# Patient Record
Sex: Female | Born: 1990 | Race: White | Hispanic: No | Marital: Single | Smoking: Former smoker
Health system: Southern US, Community
[De-identification: ages and names within clinical notes are randomized; demographics above are authoritative.]

## PROBLEM LIST (undated history)

## (undated) DIAGNOSIS — G43909 Migraine, unspecified, not intractable, without status migrainosus: Secondary | ICD-10-CM

## (undated) HISTORY — DX: Migraine, unspecified, not intractable, without status migrainosus: G43.909

## (undated) HISTORY — PX: NO PAST SURGERIES: SHX2092

---

## 2010-04-26 ENCOUNTER — Emergency Department: Payer: Self-pay | Admitting: Internal Medicine

## 2012-10-02 ENCOUNTER — Emergency Department: Payer: Self-pay | Admitting: Emergency Medicine

## 2012-10-02 LAB — CBC
HCT: 39.6 % (ref 35.0–47.0)
HGB: 13.8 g/dL (ref 12.0–16.0)
MCH: 30.3 pg (ref 26.0–34.0)
MCV: 87 fL (ref 80–100)
RBC: 4.55 10*6/uL (ref 3.80–5.20)
WBC: 6.3 10*3/uL (ref 3.6–11.0)

## 2013-12-16 ENCOUNTER — Emergency Department: Payer: Self-pay | Admitting: Emergency Medicine

## 2015-10-07 LAB — CBC AND DIFFERENTIAL
HCT: 39 % (ref 36–46)
Hemoglobin: 12.8 g/dL (ref 12.0–16.0)
Platelets: 210 10*3/uL (ref 150–399)
WBC: 5.3 10^3/mL

## 2015-10-08 LAB — BASIC METABOLIC PANEL
BUN: 14 mg/dL (ref 4–21)
CREATININE: 0.7 mg/dL (ref 0.5–1.1)
Glucose: 81 mg/dL
Potassium: 4.1 mmol/L (ref 3.4–5.3)
SODIUM: 138 mmol/L (ref 137–147)

## 2015-10-08 LAB — LIPID PANEL
Cholesterol: 146 mg/dL (ref 0–200)
HDL: 72 mg/dL — AB (ref 35–70)
LDL Cholesterol: 64 mg/dL
TRIGLYCERIDES: 52 mg/dL (ref 40–160)

## 2015-10-08 LAB — HEPATIC FUNCTION PANEL
ALT: 13 U/L (ref 7–35)
AST: 16 U/L (ref 13–35)
Alkaline Phosphatase: 52 U/L (ref 25–125)
BILIRUBIN, TOTAL: 0.7 mg/dL

## 2015-10-08 LAB — TSH: TSH: 1.66 u[IU]/mL (ref 0.41–5.90)

## 2016-10-09 ENCOUNTER — Emergency Department
Admission: EM | Admit: 2016-10-09 | Discharge: 2016-10-09 | Disposition: A | Discharge status: Home / Self Care | Payer: BLUE CROSS/BLUE SHIELD | Attending: Emergency Medicine | Admitting: Emergency Medicine

## 2016-10-09 ENCOUNTER — Encounter: Payer: Self-pay | Admitting: Emergency Medicine

## 2016-10-09 DIAGNOSIS — Z23 Encounter for immunization: Secondary | ICD-10-CM | POA: Insufficient documentation

## 2016-10-09 DIAGNOSIS — Y999 Unspecified external cause status: Secondary | ICD-10-CM | POA: Insufficient documentation

## 2016-10-09 DIAGNOSIS — S0121XA Laceration without foreign body of nose, initial encounter: Secondary | ICD-10-CM | POA: Diagnosis not present

## 2016-10-09 DIAGNOSIS — Y929 Unspecified place or not applicable: Secondary | ICD-10-CM | POA: Insufficient documentation

## 2016-10-09 DIAGNOSIS — W541XXA Struck by dog, initial encounter: Secondary | ICD-10-CM | POA: Insufficient documentation

## 2016-10-09 DIAGNOSIS — Y9389 Activity, other specified: Secondary | ICD-10-CM | POA: Diagnosis not present

## 2016-10-09 DIAGNOSIS — S0993XA Unspecified injury of face, initial encounter: Secondary | ICD-10-CM | POA: Diagnosis not present

## 2016-10-09 MED ORDER — TETANUS-DIPHTH-ACELL PERTUSSIS 5-2.5-18.5 LF-MCG/0.5 IM SUSP
0.5000 mL | Freq: Once | INTRAMUSCULAR | Status: AC
  Administered 2016-10-09: 0.5 mL via INTRAMUSCULAR
  Filled 2016-10-09: qty 0.5

## 2016-10-09 NOTE — ED Provider Notes (Signed)
[facility] Emergency Department Provider Note  ____________________________________________  Time seen: Approximately 7:25 PM  I have reviewed the triage vital signs and the nursing notes.   HISTORY  Chief Complaint Facial Injury    HPI Danielle Casey is a 25 y.o. female who presents to the emergency department for evaluation for a laceration to the bridge of her nose. Patient reports that earlier this evening her dog tried to escape and when she grabbed him he "headbutted" her. She denies any act of aggression or actual dog bite. She denies loss of consciousness, visual changes, epistaxis, nausea, or vomiting. She is unsure if she up to date on tetanus.   No past medical history on file.  There are no active problems to display for this patient.   No past surgical history on file.  Prior to Admission medications   Not on File    Allergies Patient has no known allergies.  No family history on file.  Social History Social History  Substance Use Topics  . Smoking status: Not on file  . Smokeless tobacco: Not on file  . Alcohol use Not on file     Review of Systems  Eyes: No visual changes.  ENT: No difficulty breathing through nares. Cardiovascular: no chest pain. Respiratory: no cough. No SOB. Gastrointestinal: No nausea, no vomiting. Musculoskeletal: Positive for nose pain. Skin: Positive for laceration to nose with mild swelling.  Neurological: Negative for headaches, focal weakness or numbness. No loss of consciousness  10-point ROS otherwise negative.  ____________________________________________   PHYSICAL EXAM:  VITAL SIGNS: ED Triage Vitals  Enc Vitals Group     BP 10/09/16 1848 108/77     Pulse Rate 10/09/16 1848 (!) 104     Resp 10/09/16 1848 16     Temp 10/09/16 1848 98.6 F (37 C)     Temp Source 10/09/16 1848 Oral     SpO2 10/09/16 1848 99 %     Weight 10/09/16 1850 108 lb (49 kg)     Height 10/09/16 1850 5'  (1.524 m)     Head Circumference --      Peak Flow --      Pain Score 10/09/16 1849 2     Pain Loc --      Pain Edu? --      Excl. in GC? --      Constitutional: Alert and oriented. Well appearing and in no acute distress. Eyes: Conjunctivae are normal. PERRLA. EOMI. Head: Laceration to bridge of nose. Non-tender to palpation along bridge of nose. No deviation or deformity noted.  ENT:      Nose: Nares patent bilaterally.  Cardiovascular: Normal rate, regular rhythm. Normal S1 and S2.  Good peripheral circulation. Respiratory: Normal respiratory effort without tachypnea or retractions. Lungs CTAB. Good air entry to the bases with no decreased or absent breath sounds. Musculoskeletal: Full range of motion to all extremities. No gross deformities appreciated. Neurologic:  Normal speech and language. No gross focal neurologic deficits are appreciated. CN II-XII grossly intact. Skin:  1 cm superficial laceration to the bridge of the nose. No disruption of cartilage. Mild swelling noted to bridge of nose. No ecchymosis or erythema. Mild bleeding at this time.  Psychiatric: Mood and affect are normal. Speech and behavior are normal. Patient exhibits appropriate insight and judgement.   ____________________________________________   LABS (all labs ordered are listed, but only abnormal results are displayed)  Labs Reviewed - No data to display ____________________________________________  EKG  None ____________________________________________  RADIOLOGY   No results found.  ____________________________________________    PROCEDURES  Procedure(s) performed:    Marland Kitchen.Marland Kitchen.Laceration Repair Date/Time: 10/09/2016 8:07 PM Performed by: Gala RomneyUTHRIELL, Hanan Moen D Authorized by: Gala RomneyUTHRIELL, Jewelia Bocchino D   Consent:    Consent obtained:  Verbal   Consent given by:  Patient   Risks discussed:  Poor cosmetic result, pain and infection   Alternatives discussed:  No treatment Anesthesia (see  MAR for exact dosages):    Anesthesia method:  None Laceration details:    Location:  Face   Face location:  Nose   Length (cm):  1 Repair type:    Repair type:  Simple Exploration:    Hemostasis achieved with:  Direct pressure   Wound extent: no foreign bodies/material noted and no underlying fracture noted   Treatment:    Area cleansed with:  Hibiclens   Amount of cleaning:  Standard Skin repair:    Repair method:  Steri-Strips Approximation:    Approximation:  Close   Vermilion border: well-aligned   Post-procedure details:    Dressing:  Open (no dressing)   Patient tolerance of procedure:  Tolerated well, no immediate complications      Medications  Tdap (BOOSTRIX) injection 0.5 mL (not administered)     ____________________________________________   INITIAL IMPRESSION / ASSESSMENT AND PLAN / ED COURSE  Pertinent labs & imaging results that were available during my care of the patient were reviewed by me and considered in my medical decision making (see chart for details).  Review of the [place] CSRS was performed in accordance of the [facility] prior to dispensing any controlled drugs.  Clinical Course     Patient's diagnosis is consistent with laceration to nose. No x-rays deemed necessary at this time. Patient is unsure of last tetanus shot and was updated today. Laceration is closed as described above. Patient to take Tylenol or Motrin as needed at home. She will follow up with primary care as needed. Patient is given ED precautions to return to the ED for any worsening or new symptoms.     ____________________________________________  FINAL CLINICAL IMPRESSION(S) / ED DIAGNOSES  Final diagnoses:  Laceration of nose, initial encounter      NEW MEDICATIONS STARTED DURING THIS VISIT:  New Prescriptions   No medications on file        This chart was dictated using voice recognition software/Dragon. Despite best efforts to proofread, errors can occur  which can change the meaning. Any change was purely unintentional.   Racheal PatchesJonathan D Gean Larose, PA-C 10/09/16 2047    Emily FilbertJonathan E Williams, MD 10/09/16 2140

## 2016-10-09 NOTE — ED Triage Notes (Signed)
Patient presents to the ED with a small laceration to the bridge of her nose.  Patient states, "my dog head butted me".  Patient is in no obvious distress at this time.  Patient denies loss of consciousness.

## 2017-01-03 ENCOUNTER — Encounter: Payer: Self-pay | Admitting: Family Medicine

## 2017-01-03 ENCOUNTER — Ambulatory Visit (INDEPENDENT_AMBULATORY_CARE_PROVIDER_SITE_OTHER): Payer: Self-pay | Admitting: Family Medicine

## 2017-01-03 VITALS — BP 116/79 | HR 112 | Temp 97.4°F | Wt 104.6 lb

## 2017-01-03 DIAGNOSIS — N6312 Unspecified lump in the right breast, upper inner quadrant: Secondary | ICD-10-CM | POA: Insufficient documentation

## 2017-01-03 DIAGNOSIS — Z309 Encounter for contraceptive management, unspecified: Secondary | ICD-10-CM | POA: Insufficient documentation

## 2017-01-03 DIAGNOSIS — N3001 Acute cystitis with hematuria: Secondary | ICD-10-CM

## 2017-01-03 DIAGNOSIS — R0789 Other chest pain: Secondary | ICD-10-CM | POA: Insufficient documentation

## 2017-01-03 DIAGNOSIS — Z30011 Encounter for initial prescription of contraceptive pills: Secondary | ICD-10-CM

## 2017-01-03 LAB — POCT URINALYSIS DIPSTICK
BILIRUBIN UA: NEGATIVE
GLUCOSE UA: NEGATIVE
KETONES UA: NEGATIVE
NITRITE UA: NEGATIVE
PH UA: 6.5
Protein, UA: NEGATIVE
Urobilinogen, UA: 0.2

## 2017-01-03 MED ORDER — NORGESTIMATE-ETH ESTRADIOL 0.25-35 MG-MCG PO TABS: 1.0000 | ORAL_TABLET | Freq: Every day | ORAL | 11 refills | Status: DC

## 2017-01-03 MED ORDER — NITROFURANTOIN MONOHYD MACRO 100 MG PO CAPS: 100.0000 mg | ORAL_CAPSULE | Freq: Two times a day (BID) | ORAL | 0 refills | Status: DC

## 2017-01-03 NOTE — Assessment & Plan Note (Signed)
New problem. Likely benign (possible fibroadenoma) given tenderness/pain. Patient does have a family hx of early breast cancer.  Discussed options with patient today.  Watchful waiting.  Follow up in 2 weeks. At that time will re-evaluate and discuss mammogram.

## 2017-01-03 NOTE — Assessment & Plan Note (Signed)
New acute problem. Treating with macrobid while awaiting culture.

## 2017-01-03 NOTE — Assessment & Plan Note (Signed)
New problem. No risk factors. Young, healthy female. Labs today. Doubt cardiac etiology. Likely stress, caffeine, possibly MSK. Reassurance provided today. F/U in 2 weeks.

## 2017-01-03 NOTE — Assessment & Plan Note (Signed)
Starting Sprintec today.

## 2017-01-03 NOTE — Progress Notes (Signed)
Pre visit review using our clinic review tool, if applicable. No additional management support is needed unless otherwise documented below in the visit note. 

## 2017-01-03 NOTE — Patient Instructions (Signed)
See you in 2 weeks.  Medication as prescribed.  Take care  Dr. Adriana Simasook

## 2017-01-03 NOTE — Progress Notes (Signed)
Subjective:  Patient ID: Danielle Casey, female    DOB: 20-Aug-1991  Age: 26 y.o. MRN: [id number]  CC: Breast lump, chest pain, UTI, Contraception  HPI Danielle Casey is a 26 y.o. female presents to the clinic today as a new patient with the above complaints.  Chest pain  X 1 year.  Intermittent.  Located sternally.  Occurs with activity and at rest.  Sharp and lasts for seconds and resolves spontaneously.  No SOB.  No diaphoresis.  No palpitations.  She reports significant stress,little sleep, and frequent use of "5 Hour"  She does have a family hx of CV disease but no personal risk factors.  Breast pain, lump  She noted some R breast pain 2 weeks ago.  She palpated her breast and felt a lump which was painful.  Located at the 2 o'clock position.  Tender to palpation.  Last menstrual cycle was 2 weeks ago.  Has a family hx of early breast cancer (Mother with breast cancer at 28-29).  No nipple discharge.  UTI  Reports 1 week history of urgency, dysuria.  Recent hematuria.  No known exacerbating or relieving factors.  No flank pain, abdominal pain, fever, chills.   Contraception  Currently sexually active and uses condoms.  Wants to start OCP's.  PMH, Surgical Hx, Family Hx, Social History reviewed and updated as below.  Past Medical History:  Diagnosis Date  . Migraines     Past Surgical History:  Procedure Laterality Date  . NO PAST SURGERIES     Family History  Problem Relation Age of Onset  . Breast cancer Mother   . Mental illness Mother   . Breast cancer Maternal Grandmother   . Arthritis Maternal Grandmother   . Hypertension Maternal Grandmother   . Diabetes Maternal Grandmother   . Mental illness Maternal Grandmother    Social History  Substance Use Topics  . Smoking status: Never Smoker  . Smokeless tobacco: Never Used  . Alcohol use 0.6 - 1.2 oz/week    1 - 2 Standard drinks or equivalent per week   Review of  Systems  Cardiovascular: Positive for chest pain.  Gastrointestinal: Positive for diarrhea.  Genitourinary: Positive for dysuria, hematuria and urgency.       Breast lump/pain.  Musculoskeletal:       Joint pain.  Neurological: Positive for headaches.  Psychiatric/Behavioral:       Stress.  All other systems reviewed and are negative.  Objective:   Today's Vitals: BP 116/79   Pulse (!) 112   Temp 97.4 F (36.3 C) (Oral)   Wt 104 lb 9.6 oz (47.4 kg)   SpO2 97%   BMI 20.43 kg/m   Physical Exam  Constitutional: She is oriented to person, place, and time. She appears well-developed. No distress.  HENT:  Head: Normocephalic and atraumatic.  Mouth/Throat: Oropharynx is clear and moist.  Eyes: Conjunctivae are normal.  Neck: Neck supple.  Cardiovascular: Normal rate and regular rhythm.   Pulmonary/Chest: Effort normal and breath sounds normal. She exhibits no tenderness.  Abdominal: Soft. She exhibits no distension. There is no tenderness.  Musculoskeletal: Normal range of motion.  Neurological: She is alert and oriented to person, place, and time.  Skin: Skin is warm. No rash noted.  Psychiatric: She has a normal mood and affect.  Breast - Right breast: small lump palpated at 2 o'clock position; tender to palpation. No axillary nodes. No nipple discharge.   Assessment & Plan:   Problem List Items  Addressed This Visit    Acute cystitis with hematuria    New acute problem. Treating with macrobid while awaiting culture.      Relevant Orders   POCT Urinalysis Dipstick (Completed)   Urine Culture   Breast lump on right side at 2 o'clock position    New problem. Likely benign (possible fibroadenoma) given tenderness/pain. Patient does have a family hx of early breast cancer.  Discussed options with patient today.  Watchful waiting.  Follow up in 2 weeks. At that time will re-evaluate and discuss mammogram.      Contraception management    Starting [place]el today.        Other chest pain - Primary    New problem. No risk factors. Young, healthy female. Labs today. Doubt cardiac etiology. Likely stress, caffeine, possibly MSK. Reassurance provided today. F/U in 2 weeks.      Relevant Orders   CBC   Comp Met (CMET)   Lipid panel   TSH      Outpatient Encounter Prescriptions as of 01/03/2017  Medication Sig  . nitrofurantoin, macrocrystal-monohydrate, (MACROBID) 100 MG capsule Take 1 capsule (100 mg total) by mouth 2 (two) times daily.  . norgestimate-ethinyl estradiol (SPRINTEC 28) 0.25-35 MG-MCG tablet Take 1 tablet by mouth daily.   No facility-administered encounter medications on file as of 01/03/2017.     Follow-up: Return in about 2 weeks (around 01/17/2017).  [place]

## 2017-01-04 LAB — LIPID PANEL
Chol/HDL Ratio: 1.7 ratio units (ref 0.0–4.4)
Cholesterol, Total: 146 mg/dL (ref 100–199)
HDL: 84 mg/dL (ref >39)
LDL Calculated: 54 mg/dL (ref 0–99)
TRIGLYCERIDES: 40 mg/dL (ref 0–149)
VLDL CHOLESTEROL CAL: 8 mg/dL (ref 5–40)

## 2017-01-04 LAB — COMPREHENSIVE METABOLIC PANEL
A/G RATIO: 1.6 (ref 1.2–2.2)
ALK PHOS: 82 IU/L (ref 39–117)
ALT: 30 IU/L (ref 0–32)
AST: 28 IU/L (ref 0–40)
Albumin: 4.6 g/dL (ref 3.5–5.5)
BILIRUBIN TOTAL: 0.6 mg/dL (ref 0.0–1.2)
BUN/Creatinine Ratio: 17 (ref 9–23)
BUN: 11 mg/dL (ref 6–20)
CO2: 23 mmol/L (ref 18–29)
Calcium: 9.3 mg/dL (ref 8.7–10.2)
Chloride: 100 mmol/L (ref 96–106)
Creatinine, Ser: 0.66 mg/dL (ref 0.57–1.00)
GFR calc non Af Amer: 121 mL/min/{1.73_m2} (ref >59)
GFR, EST AFRICAN AMERICAN: 140 mL/min/{1.73_m2} (ref >59)
GLUCOSE: 114 mg/dL — AB (ref 65–99)
Globulin, Total: 2.9 g/dL (ref 1.5–4.5)
POTASSIUM: 4.2 mmol/L (ref 3.5–5.2)
Sodium: 139 mmol/L (ref 134–144)
TOTAL PROTEIN: 7.5 g/dL (ref 6.0–8.5)

## 2017-01-04 LAB — TSH: TSH: 1.2 u[IU]/mL (ref 0.450–4.500)

## 2017-01-04 LAB — CBC
Hematocrit: 42.4 % (ref 34.0–46.6)
Hemoglobin: 13.2 g/dL (ref 11.1–15.9)
MCH: 28 pg (ref 26.6–33.0)
MCHC: 31.1 g/dL — ABNORMAL LOW (ref 31.5–35.7)
MCV: 90 fL (ref 79–97)
PLATELETS: 286 10*3/uL (ref 150–379)
RBC: 4.71 x10E6/uL (ref 3.77–5.28)
RDW: 13.8 % (ref 12.3–15.4)
WBC: 7.5 10*3/uL (ref 3.4–10.8)

## 2017-01-05 LAB — URINE CULTURE

## 2017-01-09 ENCOUNTER — Encounter: Payer: Self-pay | Admitting: Family Medicine

## 2017-01-24 ENCOUNTER — Ambulatory Visit: Payer: BLUE CROSS/BLUE SHIELD | Admitting: Family Medicine

## 2017-01-30 ENCOUNTER — Ambulatory Visit: Payer: BLUE CROSS/BLUE SHIELD | Admitting: Family Medicine

## 2017-02-06 ENCOUNTER — Ambulatory Visit: Payer: BLUE CROSS/BLUE SHIELD | Admitting: Family Medicine

## 2017-02-13 ENCOUNTER — Other Ambulatory Visit (HOSPITAL_COMMUNITY)
Admission: RE | Admit: 2017-02-13 | Discharge: 2017-02-13 | Disposition: A | Discharge status: Home / Self Care | Payer: Self-pay | Source: Ambulatory Visit | Attending: Family Medicine | Admitting: Family Medicine

## 2017-02-13 ENCOUNTER — Encounter: Payer: Self-pay | Admitting: Family Medicine

## 2017-02-13 ENCOUNTER — Ambulatory Visit (INDEPENDENT_AMBULATORY_CARE_PROVIDER_SITE_OTHER): Payer: Self-pay | Admitting: Family Medicine

## 2017-02-13 VITALS — BP 110/70 | HR 93 | Temp 98.3°F | Wt 109.2 lb

## 2017-02-13 DIAGNOSIS — R87810 Cervical high risk human papillomavirus (HPV) DNA test positive: Secondary | ICD-10-CM | POA: Insufficient documentation

## 2017-02-13 DIAGNOSIS — Z124 Encounter for screening for malignant neoplasm of cervix: Secondary | ICD-10-CM | POA: Insufficient documentation

## 2017-02-13 DIAGNOSIS — R0789 Other chest pain: Secondary | ICD-10-CM

## 2017-02-13 DIAGNOSIS — N6312 Unspecified lump in the right breast, upper inner quadrant: Secondary | ICD-10-CM

## 2017-02-13 NOTE — Assessment & Plan Note (Signed)
Resolved. No further chest pain.

## 2017-02-13 NOTE — Progress Notes (Signed)
Pre visit review using our clinic review tool, if applicable. No additional management support is needed unless otherwise documented below in the visit note. 

## 2017-02-13 NOTE — Assessment & Plan Note (Signed)
Persistent, uncertain prognosis at this time. Needs imaging. Arranging mammogram and US.

## 2017-02-13 NOTE — Progress Notes (Signed)
   Subjective:  Patient ID: Danielle Casey, female    DOB: August 23, 1991  Age: 26 y.o. MRN: [id number]  CC: Follow up  HPI:  26 year old female presents for follow up regarding the items below.  Breast lump  Continues to persist.  No longer tender be still present.  Family history of early breast cancer.  Chest pain  No further chest pain.  Resolved.  Feeling well.  Cervical cancer screening  In need of screening.  No reports of prior abnormal pap smears.  No reports of DUB, vaginal discharge.   Social Hx   Social History   Social History  . Marital status: Single    Spouse name: N/A  . Number of children: N/A  . Years of education: N/A   Social History Main Topics  . Smoking status: Never Smoker  . Smokeless tobacco: Never Used  . Alcohol use 0.6 - 1.2 oz/week    1 - 2 Standard drinks or equivalent per week  . Drug use: No  . Sexual activity: Yes    Partners: Male   Other Topics Concern  . None   Social History Narrative  . None    Review of Systems  Cardiovascular: Negative for chest pain.  Breast lump.    Objective:  BP 110/70   Pulse 93   Temp 98.3 F (36.8 C) (Oral)   Wt 109 lb 4 oz (49.6 kg)   SpO2 98%   BMI 21.34 kg/m   BP/Weight 02/13/2017 01/03/2017 10/09/2016  Systolic BP 110 116 108  Diastolic BP 70 79 77  Wt. (Lbs) 109.25 104.6 108  BMI 21.34 20.43 21.09    Physical Exam  Constitutional: She is oriented to person, place, and time. She appears well-developed. No distress.  Pulmonary/Chest: Effort normal.  Genitourinary:  Genitourinary Comments: Pelvic Exam: External: normal female genitalia without lesions or masses Vagina: normal without lesions or masses Cervix: normal without lesions or masses. Pap smear: performed   Neurological: She is alert and oriented to person, place, and time.  Psychiatric: She has a normal mood and affect.  Breast - Right breast with a small palpable mass at the 2 o'clock position.  Lab  Results  Component Value Date   WBC 7.5 01/03/2017   HGB 12.8 10/07/2015   HCT 42.4 01/03/2017   PLT 286 01/03/2017   GLUCOSE 114 (H) 01/03/2017   CHOL 146 01/03/2017   TRIG 40 01/03/2017   HDL 84 01/03/2017   LDLCALC 54 01/03/2017   ALT 30 01/03/2017   AST 28 01/03/2017   NA 139 01/03/2017   K 4.2 01/03/2017   CL 100 01/03/2017   CREATININE 0.66 01/03/2017   BUN 11 01/03/2017   CO2 23 01/03/2017   TSH 1.200 01/03/2017    Assessment & Plan:   Problem List Items Addressed This Visit    Breast lump on right side at 2 o'clock position - Primary    Persistent, uncertain prognosis at this time. Needs imaging. Arranging mammogram and US.      Other chest pain    Resolved. No further chest pain.      Screening for cervical cancer    Pap smear performed today.      Relevant Orders   Cytology - PAP   US BREAST LTD UNI RIGHT INC AXILLA      Follow-up: Pending mammogram  Everlene OtherJayce Carena Stream DO [facility] [place]

## 2017-02-13 NOTE — Assessment & Plan Note (Signed)
Pap smear performed today. 

## 2017-02-13 NOTE — Patient Instructions (Signed)
Be sure to get your mammogram.  We will call with results of your pap.  Take care  Dr. Adriana Simasook

## 2017-02-17 LAB — CYTOLOGY - PAP
Diagnosis: NEGATIVE
HPV (WINDOPATH): DETECTED — AB
HPV 16/18/45 genotyping: NEGATIVE

## 2017-02-20 ENCOUNTER — Telehealth: Payer: Self-pay

## 2017-02-20 NOTE — Telephone Encounter (Signed)
Referral is after next pap in 1 year (if positive). No need to rush.

## 2017-02-20 NOTE — Telephone Encounter (Signed)
A voicemail was left with results per DPR. Pt may callback please transfer call to me.

## 2017-02-20 NOTE — Telephone Encounter (Signed)
Pt okay with referral

## 2017-02-20 NOTE — Telephone Encounter (Signed)
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

## 2017-02-20 NOTE — Telephone Encounter (Signed)
Pt was advised of results. Will she need a repeat PAP. Pt questioned what happens if secondary PAP is positive.

## 2017-02-20 NOTE — Telephone Encounter (Signed)
Repeat pap in 1 year. If abnormal will send to GYN.

## 2017-02-20 NOTE — Telephone Encounter (Signed)
Pt called back returning your call. Thank you!  Call pt @ [phone]

## 2017-02-22 ENCOUNTER — Ambulatory Visit
Admission: RE | Admit: 2017-02-22 | Discharge: 2017-02-22 | Disposition: A | Discharge status: Home / Self Care | Payer: Self-pay | Source: Ambulatory Visit | Attending: Family Medicine | Admitting: Family Medicine

## 2017-02-22 DIAGNOSIS — Z124 Encounter for screening for malignant neoplasm of cervix: Secondary | ICD-10-CM

## 2017-03-08 ENCOUNTER — Telehealth: Payer: Self-pay | Admitting: Family Medicine

## 2017-03-08 NOTE — Telephone Encounter (Signed)
Pt called about the doctor where she got the mammo done is recommending her to get a MRI. Referral needed please and thank you! Pt wants to know if the doctor mentioned for pt to have a MRI done?  Call pt @ [phone]. Thank you!

## 2017-03-09 ENCOUNTER — Other Ambulatory Visit: Payer: Self-pay | Admitting: Family Medicine

## 2017-03-09 DIAGNOSIS — Z1239 Encounter for other screening for malignant neoplasm of breast: Secondary | ICD-10-CM

## 2017-03-09 NOTE — Telephone Encounter (Signed)
Order placed

## 2017-03-14 ENCOUNTER — Other Ambulatory Visit: Payer: Self-pay | Admitting: Family Medicine

## 2017-03-14 ENCOUNTER — Telehealth: Payer: Self-pay | Admitting: Family Medicine

## 2017-03-14 DIAGNOSIS — Z1239 Encounter for other screening for malignant neoplasm of breast: Secondary | ICD-10-CM

## 2017-03-14 NOTE — Telephone Encounter (Signed)
Gabby from [facility] called and stated that pt's order needs to be changed. It needs to be changed to MRI of b/l breast with and without contrast. Please advise, thank you!

## 2017-10-16 ENCOUNTER — Other Ambulatory Visit: Payer: Self-pay | Admitting: Family Medicine

## 2017-10-17 NOTE — Telephone Encounter (Signed)
Patient is switching care to Dr. Birdie SonsSonnenberg and her last PAP was 3/18 she is [facility] til appointment.

## 2018-01-02 ENCOUNTER — Ambulatory Visit: Payer: Self-pay | Admitting: Family Medicine

## 2018-01-17 ENCOUNTER — Other Ambulatory Visit: Payer: Self-pay | Admitting: Family Medicine

## 2018-01-31 ENCOUNTER — Ambulatory Visit: Payer: Self-pay | Admitting: Family Medicine

## 2018-01-31 DIAGNOSIS — Z0289 Encounter for other administrative examinations: Secondary | ICD-10-CM

## 2018-04-15 ENCOUNTER — Other Ambulatory Visit: Payer: Self-pay | Admitting: Family Medicine

## 2018-06-29 ENCOUNTER — Encounter: Payer: Self-pay | Admitting: Family

## 2018-07-11 ENCOUNTER — Encounter: Payer: Self-pay | Admitting: Family

## 2018-07-11 ENCOUNTER — Ambulatory Visit: Payer: BLUE CROSS/BLUE SHIELD | Admitting: Family

## 2018-07-11 ENCOUNTER — Telehealth: Payer: Self-pay | Admitting: *Deleted

## 2018-07-11 VITALS — BP 108/66 | HR 81 | Temp 98.7°F | Resp 16 | Ht 61.0 in | Wt 93.2 lb

## 2018-07-11 DIAGNOSIS — Z30011 Encounter for initial prescription of contraceptive pills: Secondary | ICD-10-CM

## 2018-07-11 DIAGNOSIS — Z124 Encounter for screening for malignant neoplasm of cervix: Secondary | ICD-10-CM

## 2018-07-11 DIAGNOSIS — E559 Vitamin D deficiency, unspecified: Secondary | ICD-10-CM | POA: Diagnosis not present

## 2018-07-11 DIAGNOSIS — F909 Attention-deficit hyperactivity disorder, unspecified type: Secondary | ICD-10-CM | POA: Insufficient documentation

## 2018-07-11 DIAGNOSIS — R634 Abnormal weight loss: Secondary | ICD-10-CM | POA: Insufficient documentation

## 2018-07-11 DIAGNOSIS — Z7721 Contact with and (suspected) exposure to potentially hazardous body fluids: Secondary | ICD-10-CM | POA: Insufficient documentation

## 2018-07-11 DIAGNOSIS — R41840 Attention and concentration deficit: Secondary | ICD-10-CM | POA: Diagnosis not present

## 2018-07-11 LAB — COMPREHENSIVE METABOLIC PANEL
ALT: 20 U/L (ref 0–35)
AST: 19 U/L (ref 0–37)
Albumin: 4.6 g/dL (ref 3.5–5.2)
Alkaline Phosphatase: 63 U/L (ref 39–117)
BUN: 15 mg/dL (ref 6–23)
CO2: 29 meq/L (ref 19–32)
Calcium: 9.9 mg/dL (ref 8.4–10.5)
Chloride: 105 mEq/L (ref 96–112)
Creatinine, Ser: 0.82 mg/dL (ref 0.40–1.20)
GFR: 87.62 mL/min (ref >60.00)
GLUCOSE: 87 mg/dL (ref 70–99)
POTASSIUM: 3.9 meq/L (ref 3.5–5.1)
SODIUM: 140 meq/L (ref 135–145)
Total Bilirubin: 0.5 mg/dL (ref 0.2–1.2)
Total Protein: 7.5 g/dL (ref 6.0–8.3)

## 2018-07-11 LAB — CBC WITH DIFFERENTIAL/PLATELET
BASOS ABS: 0.1 10*3/uL (ref 0.0–0.1)
Basophils Relative: 1.1 % (ref 0.0–3.0)
EOS PCT: 1.6 % (ref 0.0–5.0)
Eosinophils Absolute: 0.1 10*3/uL (ref 0.0–0.7)
HCT: 39 % (ref 36.0–46.0)
Hemoglobin: 13 g/dL (ref 12.0–15.0)
LYMPHS ABS: 2.5 10*3/uL (ref 0.7–4.0)
Lymphocytes Relative: 46.3 % — ABNORMAL HIGH (ref 12.0–46.0)
MCHC: 33.2 g/dL (ref 30.0–36.0)
MCV: 86 fl (ref 78.0–100.0)
MONO ABS: 0.4 10*3/uL (ref 0.1–1.0)
MONOS PCT: 6.8 % (ref 3.0–12.0)
NEUTROS ABS: 2.4 10*3/uL (ref 1.4–7.7)
Neutrophils Relative %: 44.2 % (ref 43.0–77.0)
PLATELETS: 204 10*3/uL (ref 150.0–400.0)
RBC: 4.54 Mil/uL (ref 3.87–5.11)
RDW: 13.2 % (ref 11.5–15.5)
WBC: 5.4 10*3/uL (ref 4.0–10.5)

## 2018-07-11 LAB — POCT URINE PREGNANCY: Preg Test, Ur: NEGATIVE

## 2018-07-11 LAB — VITAMIN D 25 HYDROXY (VIT D DEFICIENCY, FRACTURES): VITD: 22.15 ng/mL — ABNORMAL LOW (ref 30.00–100.00)

## 2018-07-11 LAB — TSH: TSH: 2.4 u[IU]/mL (ref 0.35–4.50)

## 2018-07-11 MED ORDER — NORGESTIMATE-ETH ESTRADIOL 0.25-35 MG-MCG PO TABS: 1.0000 | ORAL_TABLET | Freq: Every day | ORAL | 4 refills | Status: DC

## 2018-07-11 NOTE — Patient Instructions (Addendum)
Eye exam  Exercise for concentration  Please find out more information about your grandmother. Ensure you know all about family history of bleeding disorder , blood clots as we discussed.  Then call and let me know and we will go from there  Today we discussed referrals, orders. neuropsychiatry   I have placed these orders in the system for you.  Please be sure to give us a call if you have not heard from our office regarding scheduling a test or regarding referral in a timely manner.  It is very important that you let me know as soon as possible.

## 2018-07-11 NOTE — Assessment & Plan Note (Signed)
16 pound weight loss.  Suspect stress may be contributory however pending lab evaluation.  Will continue to follow closely.

## 2018-07-11 NOTE — Progress Notes (Signed)
Subjective:    Patient ID: Danielle Casey, female    DOB: 20-Jun-1991, 27 y.o.   MRN: [id number]  CC: Danielle Casey is a 27 y.o. female who presents today to establish care.    HPI: CC: problems focusing  Feels like more forgetful . H/o ADHD as child and never on medication at that time. Getting 7-8 hours of sleep per night. No excersising.  Drinks 5 hour energy shots as thinks mind is so 'cloudy' with some relief. Some stress with work as very serious. No depression.   No blurry vision or HA.   Ran out of sprintec and would like refill today   No personal history of DVT, migraine with aura. No history of cancer. Patient states she's not pregnant or breast-feeding.No concern for STDs.  Pap due; hpv positive. LMP: 2 weeks ago.   Non smoker.   No sure however thinks grandmother may have had a DVT. She will confirm this.   Weight loss has not been intentional. 'Eat what I want.'  Eating 3 meals per day. May be stress. No night sweats, bone pain.   Works in [employer]. Works from 2:30pm - 10:30pm.      HISTORY:  Past Medical History:  Diagnosis Date  . Migraines    Past Surgical History:  Procedure Laterality Date  . NO PAST SURGERIES     Family History  Problem Relation Age of Onset  . Breast cancer Mother 2920  . Mental illness Mother   . Breast cancer Maternal Grandmother   . Arthritis Maternal Grandmother   . Hypertension Maternal Grandmother   . Diabetes Maternal Grandmother   . Mental illness Maternal Grandmother     Allergies: Patient has no known allergies. No current outpatient medications on file prior to visit.   No current facility-administered medications on file prior to visit.     Social History   Tobacco Use  . Smoking status: Never Smoker  . Smokeless tobacco: Never Used  Substance Use Topics  . Alcohol use: Yes    Alcohol/week: 1.0 - 2.0 standard drinks    Types: 1 - 2 Standard drinks or equivalent per week  . Drug  use: No    Review of Systems  Constitutional: Positive for unexpected weight change. Negative for activity change, appetite change, chills and fever.  Respiratory: Negative for cough.   Cardiovascular: Negative for chest pain and palpitations.  Gastrointestinal: Negative for nausea and vomiting.  Genitourinary: Negative for pelvic pain and vaginal discharge.  Neurological: Negative for headaches.  Hematological: Negative for adenopathy.      Objective:    BP 108/66 (BP Location: Left Arm, Patient Position: Sitting, Cuff Size: Normal)   Pulse 81   Temp 98.7 F (37.1 C) (Oral)   Resp 16   Ht 5\' 1"  (1.549 m)   Wt 93 lb 4 oz (42.3 kg)   LMP 06/28/2018 (Approximate)   SpO2 97%   BMI 17.62 kg/m  BP Readings from Last 3 Encounters:  07/11/18 108/66  02/13/17 110/70  01/03/17 116/79   Wt Readings from Last 3 Encounters:  07/11/18 93 lb 4 oz (42.3 kg)  02/13/17 109 lb 4 oz (49.6 kg)  01/03/17 104 lb 9.6 oz (47.4 kg)    Physical Exam  Constitutional: She appears well-developed and well-nourished.  Eyes: Conjunctivae are normal.  Neck: No thyroid mass and no thyromegaly present.  Cardiovascular: Normal rate, regular rhythm, normal heart sounds and normal pulses.  Pulmonary/Chest: Effort normal  and breath sounds normal. She has no wheezes. She has no rhonchi. She has no rales.  Lymphadenopathy:       Head (right side): No submental, no submandibular, no tonsillar, no preauricular, no posterior auricular and no occipital adenopathy present.       Head (left side): No submental, no submandibular, no tonsillar, no preauricular, no posterior auricular and no occipital adenopathy present.  Neurological: She is alert.  Skin: Skin is warm and dry.  Psychiatric: She has a normal mood and affect. Her speech is normal and behavior is normal. Thought content normal.  Vitals reviewed.      Assessment & Plan:   Problem List Items Addressed This Visit      Other   Contraception  management    Patient is unsure as may have a remote history of DVT in her family.  Patient will confirm this and give me a call back.  If negative, would be comfortable starting oral contraceptive.  If positive, with either consult hematology or consult with OB/GYN for potential IUD.      Relevant Orders   POCT urine pregnancy (Completed)   Hepatitis C antibody   HIV antibody   Screening for cervical cancer    History of positive HPV.  Not a high-grade strain- negative 16, 18/45  Patient will return here for repeat Pap smear with co testing, she is overdue.      Weight loss - Primary    16 pound weight loss.  Suspect stress may be contributory however pending lab evaluation.  Will continue to follow closely.      Relevant Orders   CBC with Differential/Platelet   Comprehensive metabolic panel   VITAMIN D 25 Hydroxy (Vit-D Deficiency, Fractures)   TSH   Concentration deficit    Pending evaluation for ADHD.      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      Relevant Orders   Hepatitis C antibody   HIV antibody       I have discontinued Danielle Casey SPRINTEC 28.   No orders of the defined types were placed in this encounter.   Return precautions given.   Risks, benefits, and alternatives of the medications and treatment plan prescribed today were discussed, and patient expressed understanding.   Education regarding symptom management and diagnosis given to patient on AVS.  Continue to follow with Danielle Casey, Danielle Nygren Casey, Danielle Casey for routine health maintenance.   Danielle Casey and I agreed with plan.   Danielle PlowmanMargaret Anaissa Macfadden, Danielle Casey

## 2018-07-11 NOTE — Telephone Encounter (Signed)
Patient advised. She will call back to schedule appointment. 

## 2018-07-11 NOTE — Telephone Encounter (Signed)
Copied from CRM [phone]#[id number]. Topic: Inquiry >> Jul 11, 2018 12:59 PM Yvonna Alanisobinson, Andra M wrote: Reason for CRM: Patient called to let Rennie PlowmanMargaret Arnett know that there is no history of blood clots in her family. Patient saw Jason Cooprnett this morning, and was told to call bak with this information.       Thank You!!!

## 2018-07-11 NOTE — Assessment & Plan Note (Signed)
Patient is unsure as may have a remote history of DVT in her family.  Patient will confirm this and give me a call back.  If negative, would be comfortable starting oral contraceptive.  If positive, with either consult hematology or consult with OB/GYN for potential IUD.

## 2018-07-11 NOTE — Assessment & Plan Note (Signed)
Pending evaluation for ADHD.

## 2018-07-11 NOTE — Assessment & Plan Note (Signed)
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

## 2018-07-11 NOTE — Telephone Encounter (Signed)
Noted  I sent in refill of birth control for one year.  Please ensure she returns for pap  Her urine pregnancy test was also negative

## 2018-07-11 NOTE — Assessment & Plan Note (Addendum)
History of positive HPV.  Not a high-grade strain- negative 16, 18/45  Patient will return here for repeat Pap smear with co testing, she is overdue.

## 2018-07-12 LAB — HIV ANTIBODY (ROUTINE TESTING W REFLEX): HIV: NONREACTIVE

## 2018-07-12 LAB — HEPATITIS C ANTIBODY
Hepatitis C Ab: NONREACTIVE
SIGNAL TO CUT-OFF: 0.01 (ref <1.00)

## 2018-07-16 ENCOUNTER — Telehealth: Payer: Self-pay

## 2018-07-16 NOTE — Telephone Encounter (Signed)
Copied from CRM [phone]. Topic: General - Other >> Jul 16, 2018  3:04 PM Trula SladeWalter, Linda F wrote: Reason for CRM:   Patient was told to come back in one month and I tried to schedule her for 08/13/18 at 10:00 but it is not letting me put an appt in for that time.  Patient was told she would get a call back to schedule that appt.  Patient scheduled to see margarett on 08-10-18

## 2018-08-10 ENCOUNTER — Inpatient Hospital Stay: Payer: BLUE CROSS/BLUE SHIELD | Admitting: Family

## 2018-08-13 ENCOUNTER — Telehealth: Payer: Self-pay

## 2018-08-13 NOTE — Telephone Encounter (Signed)
  Copied from CRM [id number]. Topic: Appointment Scheduling - Prior Auth Required for Appointment >> Aug 13, 2018 12:01 PM Crist InfanteHarrald, Kathy J wrote: Pt would like to come in Friday at 10 am.  She forgot about this appt on 9/13. Can you open up the "same day" appt at 10:15am so I can get a 30 min in there? Thank you. Unable to contact office.  Advised pt soemone will call and confirm. Her previous appt on 9/13 was not a hosp fup!  Patient has been scheduled to see Margrett on 08-17-18 at 11:00

## 2018-08-13 NOTE — Telephone Encounter (Signed)
This was a 1 month follow up appointment

## 2018-08-13 NOTE — Telephone Encounter (Signed)
Copied from CRM [id number]. Topic: Appointment Scheduling - Prior Auth Required for Appointment >> Aug 13, 2018 12:01 PM Danielle InfanteHarrald, Danielle J wrote: Pt would like to come in Friday at 10 am.  She forgot about this appt on 9/13. Can you open up the "same day" appt at 10:15am so I can get a 30 min in there? Thank you. Unable to contact office.  Advised pt soemone will call and confirm. Her previous appt on 9/13 was not a hosp fup!

## 2018-08-14 ENCOUNTER — Other Ambulatory Visit: Payer: Self-pay

## 2018-08-14 ENCOUNTER — Encounter: Payer: Self-pay | Admitting: Family

## 2018-08-14 ENCOUNTER — Ambulatory Visit: Payer: BLUE CROSS/BLUE SHIELD | Admitting: Family

## 2018-08-14 VITALS — BP 110/64 | HR 68 | Resp 16 | Ht 60.75 in | Wt 92.0 lb

## 2018-08-14 DIAGNOSIS — Z7189 Other specified counseling: Secondary | ICD-10-CM

## 2018-08-14 DIAGNOSIS — F900 Attention-deficit hyperactivity disorder, predominantly inattentive type: Secondary | ICD-10-CM | POA: Diagnosis not present

## 2018-08-14 DIAGNOSIS — Z79899 Other long term (current) drug therapy: Secondary | ICD-10-CM

## 2018-08-14 DIAGNOSIS — Z8659 Personal history of other mental and behavioral disorders: Secondary | ICD-10-CM | POA: Diagnosis not present

## 2018-08-14 DIAGNOSIS — Z719 Counseling, unspecified: Secondary | ICD-10-CM

## 2018-08-14 MED ORDER — METHYLPHENIDATE HCL ER (OSM) 18 MG PO TBCR: 18.0000 mg | EXTENDED_RELEASE_TABLET | Freq: Every day | ORAL | 0 refills | Status: DC
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
## 2018-08-14 NOTE — Telephone Encounter (Signed)
Patient calle in stating that pharm is out of stock of Concerta and would like for us to send it to [facility] [place], [place]

## 2018-08-14 NOTE — Telephone Encounter (Signed)
Resent Rx to [facility] for lower cost. Concerta 18 mg daily, # 30 with no refills. RX for above e-scribed and sent to pharmacy on record  [facility] [place] - [address] [place] [id number] Phone: [phone] Fax: [phone]

## 2018-08-15 NOTE — Telephone Encounter (Signed)
That's fine Baxter HireKristen, you place her where you think. You have great judgement on these!

## 2018-08-16 ENCOUNTER — Encounter: Payer: Self-pay | Admitting: Family

## 2018-08-17 ENCOUNTER — Encounter: Payer: Self-pay | Admitting: Family

## 2018-08-17 ENCOUNTER — Ambulatory Visit: Payer: BLUE CROSS/BLUE SHIELD | Admitting: Family

## 2018-08-17 VITALS — BP 100/70 | HR 74 | Temp 98.0°F | Resp 15 | Ht 61.0 in | Wt 91.0 lb

## 2018-08-17 DIAGNOSIS — R634 Abnormal weight loss: Secondary | ICD-10-CM

## 2018-08-17 NOTE — Assessment & Plan Note (Signed)
Lost 2 pounds in one month. Chronic fatigue. Otherwise, asymptomatic. Pending endocrine consult for further advice, evaluation.

## 2018-08-17 NOTE — Patient Instructions (Signed)
Today we discussed referrals, orders. endocrine   I have placed these orders in the system for you.  Please be sure to give us a call if you have not heard from our office regarding scheduling a test or regarding referral in a timely manner.  It is very important that you let me know as soon as possible.    Pleasure seeing you!

## 2018-08-17 NOTE — Progress Notes (Signed)
Subjective:    Patient ID: Danielle Casey, female    DOB: [birth year], 27 y.o.   MRN: [id number]  CC: Danielle Casey is a 27 y.o. female who presents today for follow up.   HPI: ON OCP Lost 2 pounds since last visit. States mother and grandmother are her size as well, very petit. No night sweats, bone pain. Chronic fatigue for years, unchanged. Drinking 5 hour Energy drink- had been drinking 1-3 per day and now drinking 1 per day.   Started conserta 3 days ago with psychology. Consciously eating more.     HISTORY:  Past Medical History:  Diagnosis Date  . Migraines    Past Surgical History:  Procedure Laterality Date  . NO PAST SURGERIES     Family History  Problem Relation Age of Onset  . Breast cancer Mother 64  . Mental illness Mother   . Breast cancer Maternal Grandmother   . Arthritis Maternal Grandmother   . Hypertension Maternal Grandmother   . Diabetes Maternal Grandmother   . Mental illness Maternal Grandmother     Allergies: Patient has no known allergies. Current Outpatient Medications on File Prior to Visit  Medication Sig Dispense Refill  . methylphenidate (CONCERTA) 18 MG PO CR tablet Take 1 tablet (18 mg total) by mouth daily. 30 tablet 0  . norgestimate-ethinyl estradiol (SPRINTEC 28) 0.25-35 MG-MCG tablet Take 1 tablet by mouth daily. 3 Package 4   No current facility-administered medications on file prior to visit.     Social History   Tobacco Use  . Smoking status: Current Every Day Smoker    Types: E-cigarettes  . Smokeless tobacco: Current User  . Tobacco comment: Juul regularly  Substance Use Topics  . Alcohol use: Yes    Alcohol/week: 1.0 - 2.0 standard drinks    Types: 1 - 2 Standard drinks or equivalent per week  . Drug use: No    Review of Systems  Constitutional: Positive for fatigue and unexpected weight change. Negative for activity change, appetite change, chills and fever.  Respiratory: Negative for cough.     Cardiovascular: Negative for chest pain and palpitations.  Gastrointestinal: Negative for nausea and vomiting.      Objective:    BP 100/70 (BP Location: Left Arm, Patient Position: Sitting, Cuff Size: Normal)   Pulse 74   Temp 98 F (36.7 C) (Oral)   Resp 15   Ht 5\' 1"  (1.549 m)   Wt 91 lb (41.3 kg)   LMP 08/11/2018 (Approximate) Comment: OC's  SpO2 99%   BMI 17.19 kg/m  BP Readings from Last 3 Encounters:  08/17/18 100/70  07/11/18 108/66  02/13/17 110/70   Wt Readings from Last 3 Encounters:  08/17/18 91 lb (41.3 kg)  07/11/18 93 lb 4 oz (42.3 kg)  02/13/17 109 lb 4 oz (49.6 kg)    Physical Exam  Constitutional: She appears well-developed and well-nourished.  Eyes: Conjunctivae are normal.  Cardiovascular: Normal rate, regular rhythm, normal heart sounds and normal pulses.  Pulmonary/Chest: Effort normal and breath sounds normal. She has no wheezes. She has no rhonchi. She has no rales.  Neurological: She is alert.  Skin: Skin is warm and dry.  Psychiatric: She has a normal mood and affect. Her speech is normal and behavior is normal. Thought content normal.  Vitals reviewed.      Assessment & Plan:   Problem List Items Addressed This Visit      Other   Weight loss - Primary  Lost 2 pounds in one month. Chronic fatigue. Otherwise, asymptomatic. Pending endocrine consult for further advice, evaluation.      Relevant Orders   Ambulatory referral to Endocrinology       I am having Danielle Casey maintain her norgestimate-ethinyl estradiol and methylphenidate.   No orders of the defined types were placed in this encounter.   Return precautions given.   Risks, benefits, and alternatives of the medications and treatment plan prescribed today were discussed, and patient expressed understanding.   Education regarding symptom management and diagnosis given to patient on AVS.  Continue to follow with Danielle Casey, Danielle Gagen G, FNP for routine health  maintenance.   Danielle MargaritaIsabel Casey Walthour and I agreed with plan.   Danielle PlowmanMargaret Bryla Burek, FNP

## 2018-08-27 ENCOUNTER — Encounter: Payer: Self-pay | Admitting: Family

## 2018-08-27 ENCOUNTER — Ambulatory Visit: Payer: BLUE CROSS/BLUE SHIELD | Admitting: Family

## 2018-08-27 VITALS — BP 98/68 | HR 76 | Resp 16 | Ht 60.75 in | Wt 88.9 lb

## 2018-08-27 DIAGNOSIS — Z79899 Other long term (current) drug therapy: Secondary | ICD-10-CM

## 2018-08-27 DIAGNOSIS — R634 Abnormal weight loss: Secondary | ICD-10-CM

## 2018-08-27 DIAGNOSIS — R41840 Attention and concentration deficit: Secondary | ICD-10-CM

## 2018-08-27 DIAGNOSIS — Z719 Counseling, unspecified: Secondary | ICD-10-CM

## 2018-08-27 DIAGNOSIS — F900 Attention-deficit hyperactivity disorder, predominantly inattentive type: Secondary | ICD-10-CM | POA: Diagnosis not present

## 2018-08-27 DIAGNOSIS — Z7189 Other specified counseling: Secondary | ICD-10-CM

## 2018-08-27 MED ORDER — LISDEXAMFETAMINE DIMESYLATE 30 MG PO CAPS: 30.0000 mg | ORAL_CAPSULE | Freq: Every day | ORAL | 0 refills | Status: DC

## 2018-08-27 NOTE — Progress Notes (Signed)
Medication Check  Patient ID: Danielle Casey  DOB: [id number]  MRN: [id number]  DATE:08/27/18 Danielle Grana, FNP  Accompanied by: Self Patient Lives with: patient and mother  HISTORY/CURRENT STATUS: HPI  Patient here for routine follow up related to ADHD and medication management. Patient here by herself at today's visit. Patient seeing some positives, but not giving the focus ability of what patient is needing for work. Still working and not having any side effects. Patient eating regularly trying to maintain weight and following up today with PCP related to this. No changes with Juul more or less, but has continued. Patient aware that she needs to stop. Has continued with Concerta 18 mg 2 tablets daily, but wanting to discuss other treatment options.   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   MEDICAL HISTORY: Appetite: eating about 12-1:00 pm most days, before work and after work  Vitamin: D3 OTC Sleep: Bedtime: 12-2:00 am  Awakens: 10-12:00 pm  Concerns: Initiation/Maintenance/Other: No issues  Individual Medical History/ Review of Systems: Changes? :Yes, recently have lost weight and will see PCP today regarding weight loss. ?Endocrine or Oncology referrals.   Family Medical/ Social History: Changes? None reported  Current Medications:  Concerta 18 mg daily, 2 tablets  Medication Side Effects: None  MENTAL HEALTH: Mental Health Issues:  None Review of Systems  Constitutional: Positive for unexpected weight change.  All other systems reviewed and are negative.  PHYSICAL EXAM; Vitals:   08/27/18 1243  BP: 98/68  Pulse: 76  Resp: 16  Weight: 88 lb 14.4 oz (40.3 kg)  Height: 5' 0.75" (1.543 m)   Body mass index is 16.94 kg/m.  General Physical Exam: Cchanged from previous exam, date:Weight loss since visit on 08/14/18.  Testing/Developmental Screens: not completed today Reviewed with  patient at today's visit previous concerns and medications.   DIAGNOSES:    ICD-10-CM   1. ADHD (attention deficit hyperactivity disorder), inattentive type F90.0   2. Weight loss R63.4   3. Concentration deficit R41.840   4. Medication management Z79.899   5. Patient counseled Z71.9   6. Goals of care, counseling/discussion Z71.89     RECOMMENDATIONS:  Patient Instructions  Patient started on Concerta now 18 mg 2 daily to be discontinued related to minimal positive effects. Discussed options and will try Vyvanse 30 mg daily, # 30 with refills. Coupon provided for cost reduction to patient today. RX for above e-scribed and sent to pharmacy on record  [facility] - [place], [place] - [address] [place] [address] Phone: [phone] Fax: [phone]  Discussed change in medications along with use, dose, effects and side effects of medications at today's visit. Patient encouraged to call the office if experiencing any negative effects.  Information discussed with patient today regarding concerns for weight loss and will f/u at PCP today. Suggested referral to endocrinology along with oncology to rule out any abnormalities.   Encouraged eating regularly throughout the day with increased calories and protein with suggestion provided today. Also recommended exercising with weight training to build and tone muscle for weight management.   Patient verbalized understanding of all topics discussed at medication check appointment.    Patient verbalized understanding of all topics discussed.  NEXT APPOINTMENT:  Return in about 4 weeks (around 09/24/2018) for follow up visit.  Medical Decision-making: More than 50% of the appointment was spent counseling and discussing diagnosis and management of symptoms with the patient  and family.  Counseling Time: 25 minutes Total Contact Time: 30 minutes

## 2018-08-27 NOTE — Patient Instructions (Addendum)
Patient started on Concerta now 18 mg 2 daily to be discontinued related to minimal positive effects. Discussed options and will try Vyvanse 30 mg daily, # 30 with refills. Coupon provided for cost reduction to patient today. RX for above e-scribed and sent to pharmacy on record  [facility] - [place], [place] - [address] [place] [address] Phone: [phone] Fax: [phone]  Discussed change in medications along with use, dose, effects and side effects of medications at today's visit. Patient encouraged to call the office if experiencing any negative effects.  Information discussed with patient today regarding concerns for weight loss and will f/u at PCP today. Suggested referral to endocrinology along with oncology to rule out any abnormalities.   Encouraged eating regularly throughout the day with increased calories and protein with suggestion provided today. Also recommended exercising with weight training to build and tone muscle for weight management.   Patient verbalized understanding of all topics discussed at medication check appointment.

## 2018-08-29 ENCOUNTER — Encounter: Payer: Self-pay | Admitting: Family

## 2018-08-29 DIAGNOSIS — Z0289 Encounter for other administrative examinations: Secondary | ICD-10-CM

## 2018-09-25 ENCOUNTER — Encounter: Payer: Self-pay | Admitting: Family

## 2018-09-25 ENCOUNTER — Ambulatory Visit: Payer: BLUE CROSS/BLUE SHIELD | Admitting: Family

## 2018-09-25 ENCOUNTER — Telehealth: Payer: Self-pay | Admitting: Family

## 2018-09-25 VITALS — BP 102/72 | HR 76 | Resp 16 | Ht 60.75 in | Wt 85.6 lb

## 2018-09-25 DIAGNOSIS — Z79899 Other long term (current) drug therapy: Secondary | ICD-10-CM

## 2018-09-25 DIAGNOSIS — Z719 Counseling, unspecified: Secondary | ICD-10-CM | POA: Diagnosis not present

## 2018-09-25 DIAGNOSIS — Z713 Dietary counseling and surveillance: Secondary | ICD-10-CM

## 2018-09-25 DIAGNOSIS — R634 Abnormal weight loss: Secondary | ICD-10-CM | POA: Diagnosis not present

## 2018-09-25 DIAGNOSIS — F900 Attention-deficit hyperactivity disorder, predominantly inattentive type: Secondary | ICD-10-CM

## 2018-09-25 DIAGNOSIS — Z7189 Other specified counseling: Secondary | ICD-10-CM

## 2018-09-25 MED ORDER — LISDEXAMFETAMINE DIMESYLATE 30 MG PO CAPS: 30.0000 mg | ORAL_CAPSULE | Freq: Every day | ORAL | 0 refills | Status: DC

## 2018-09-25 NOTE — Progress Notes (Signed)
Patient ID: Danielle Casey, female   DOB: [birth year], 27 y.o.   MRN: [id number] Medication Check/Follow up Appointment  Patient ID: Danielle Casey  DOB: [id number]  MRN: [id number]  DATE:09/25/18 [name], FNP  Accompanied by: Self Patient Lives with: mother  HISTORY/CURRENT STATUS: HPI  Patient here for routine follow up related to ADHD and medication management. Recently changes patient from Concerta to Vyvanse 30 mg with no side effects. Patient here by herself and doing well at work. Continued weight loss with no changes in appetite, eating habits, exercise or work pattern. Has contacted primary care to have referral to Endocrine or Oncology, but no returned call or appointment. Patient doing well on the change of medication with positive results, but increased cost incurred with recent Rx. Not using any energy drinks and Juul has remained the same.   EDUCATION/WORK: Employer: [employer] Hours: 2:30-10:30 pm Days: Monday-Friday.   MEDICAL HISTORY: Appetite: Good, more now and consciously eating.  Recent tooth extraction with minimal eating for 2 weeks, but had been eating good amount of foods.  Vitamin:D3 Sleep: Bedtime: 12-2:00 am  Awakens: 10-12:00 pm   Concerns: Initiation/Maintenance/Other: None recently with medication change.   Individual Medical History/ Review of Systems: Changes? :Yes, had tooth extraction and for 2 weeks has been eating only soft foods with not as much calories. No f/u with PCP recently and needs referral to Endocrine or Oncology.   Family Medical/ Social History: Changes? None  Current Medications:  Vyvanse 30 mg daily Medication Side Effects: None  MENTAL HEALTH: Mental Health Issues:  Anxiety-more recently related to weight loss Review of Systems  Constitutional: Positive for unexpected weight change.       More weight loss since last visit  Eyes:       Vision change  Psychiatric/Behavioral: Positive  for decreased concentration.  All other systems reviewed and are negative.  Patient with no concerns for toileting. Daily stool, no constipation or diarrhea. Void urine no difficulty. No enuresis.   Participate in daily oral hygiene to include brushing and flossing.  PHYSICAL EXAM; **Loss 2.8 lbs** Vitals:   09/25/18 1010  BP: 102/72  Pulse: 76  Resp: 16  Weight: 85 lb 9.6 oz (38.8 kg)  Height: 5' 0.75" (1.543 m)   Body mass index is 16.31 kg/m.  General Physical Exam: Changed from previous exam, date:Weight loss   Physical Exam  Constitutional: She is oriented to person, place, and time. She appears well-developed.  HENT:  Head: Normocephalic and atraumatic.  Right Ear: External ear normal.  Left Ear: External ear normal.  Nose: Nose normal.  Mouth/Throat: Oropharynx is clear and moist.  Eyes: Pupils are equal, round, and reactive to light. Conjunctivae and EOM are normal.  Neck: Normal range of motion. Neck supple.  Cardiovascular: Normal rate, regular rhythm, normal heart sounds and intact distal pulses.  Abdominal: Soft. Bowel sounds are normal.  Genitourinary:  Genitourinary Comments: Deferred  Musculoskeletal: Normal range of motion.  Neurological: She is alert and oriented to person, place, and time. She has normal reflexes.  Skin: Skin is warm and dry. Capillary refill takes less than 2 seconds.  Psychiatric: She has a normal mood and affect. Her behavior is normal. Judgment and thought content normal.  Vitals reviewed.  No concerns for toileting. Daily stool, no constipation or diarrhea. Void urine no difficulty. No enuresis.   Participate in daily oral hygiene to include brushing and flossing.  Testing/Developmental Screens: CGI/ASRS = 16/17  Reviewed  with patient and counseled at the visit.   DIAGNOSES:    ICD-10-CM   1. ADHD (attention deficit hyperactivity disorder), inattentive type F90.0 lisdexamfetamine (VYVANSE) 30 MG capsule  2. Weight loss,  non-intentional R63.4   3. Nutritional counseling Z71.3   4. Patient counseled Z71.9   5. Medication management Z79.899   6. Goals of care, counseling/discussion Z71.89     RECOMMENDATIONS:  Patient Instructions  Patient to continue with Vyvanse 30 mg daily, # 30 with no refills. RX for above e-scribed and sent to pharmacy on record  [facility] - [place], [place] - [address] [place] [address] Phone: [phone] Fax: [phone]  Patient also provided with information and form for [facility] related to medication cost assistance for her month Vyvanse out of pocket expense. Physician portion completed at today's visit.   Recommended patient f/u with PCP today related to ongoing issues with weight. To send note to PCP today for referral for Endocrinology or Oncology.   Counseling at this visit included the review of old records and/or current chart with the patient since last office visit with patient.  Discussed recent history and today's examination with patient with no physical changes, but 2.8 lb weight loss.   Counseled regarding recent weight loss since last visit. Patient with no reported changes other than tooth extraction with appetite change for about 2 weeks.  Encourage calorie dense foods when hungry. Encourage snacks in the afternoon/evening. Add calories to food being consumed like switching to whole milk products, using instant breakfast type powders, increasing calories of foods with butter, sour cream, mayonnaise, cheese or ranch dressing. Can add potato flakes or powdered milk.   Counseled medication pharmacokinetics, options, dosage, administration, desired effects, and possible side effects.    Advised importance of:  Good sleep hygiene (8- 10 hours per night, no TV or video games for 1 hour before bedtime) Limited screen time (none on school nights, no more than 2 hours/day on weekends, use of screen time for  motivation) Regular exercise(outside and active play) Healthy eating (drink water or milk, no sodas/sweet tea, limit portions and no seconds).   Patient verbalized understanding of all topics discussed at today's visit.   NEXT APPOINTMENT:  Return in about 3 months (around 12/26/2018) for follow up .  Medical Decision-making: More than 50% of the appointment was spent counseling and discussing diagnosis and management of symptoms with the patient and family.  Counseling Time: 30 minutes Total Contact Time: 45 minutes

## 2018-09-25 NOTE — Patient Instructions (Addendum)
Patient to continue with Vyvanse 30 mg daily, # 30 with no refills. RX for above e-scribed and sent to pharmacy on record  [facility] - [place], [place] - [address] [place] [address] Phone: [phone] Fax: [phone]  Patient also provided with information and form for [facility] related to medication cost assistance for her month Vyvanse out of pocket expense. Physician portion completed at today's visit.

## 2018-09-25 NOTE — Telephone Encounter (Signed)
Follow up note sent to PCP related to continued weight loss.

## 2018-09-26 ENCOUNTER — Telehealth: Payer: Self-pay | Admitting: Family

## 2018-09-26 ENCOUNTER — Ambulatory Visit: Payer: Self-pay | Admitting: Family

## 2018-09-26 DIAGNOSIS — R634 Abnormal weight loss: Secondary | ICD-10-CM

## 2018-09-26 NOTE — Telephone Encounter (Signed)
Left voice mail for patient to call back ok for PEC to speak to patient referral placed to endocrine

## 2018-09-26 NOTE — Telephone Encounter (Signed)
Melissa,  How soon can she see endocrine?  Kristen, Call pt. We are working on referral for weight loss to endocrine. Please have her call us if she doesn't hear from [place] in a week

## 2018-09-27 ENCOUNTER — Ambulatory Visit: Payer: BLUE CROSS/BLUE SHIELD | Admitting: Family Medicine

## 2018-09-27 ENCOUNTER — Encounter: Payer: Self-pay | Admitting: Family Medicine

## 2018-09-27 VITALS — BP 100/62 | HR 77 | Temp 98.4°F | Ht 60.0 in | Wt 87.8 lb

## 2018-09-27 DIAGNOSIS — R634 Abnormal weight loss: Secondary | ICD-10-CM

## 2018-09-27 DIAGNOSIS — R5383 Other fatigue: Secondary | ICD-10-CM

## 2018-09-27 LAB — COMPREHENSIVE METABOLIC PANEL
ALK PHOS: 58 U/L (ref 39–117)
ALT: 14 U/L (ref 0–35)
AST: 17 U/L (ref 0–37)
Albumin: 4.4 g/dL (ref 3.5–5.2)
BILIRUBIN TOTAL: 0.9 mg/dL (ref 0.2–1.2)
BUN: 14 mg/dL (ref 6–23)
CO2: 30 meq/L (ref 19–32)
Calcium: 9.2 mg/dL (ref 8.4–10.5)
Chloride: 104 mEq/L (ref 96–112)
Creatinine, Ser: 0.8 mg/dL (ref 0.40–1.20)
GFR: 90.02 mL/min (ref >60.00)
GLUCOSE: 81 mg/dL (ref 70–99)
Potassium: 3.9 mEq/L (ref 3.5–5.1)
Sodium: 139 mEq/L (ref 135–145)
TOTAL PROTEIN: 7.2 g/dL (ref 6.0–8.3)

## 2018-09-27 LAB — CBC WITH DIFFERENTIAL/PLATELET
BASOS ABS: 0.1 10*3/uL (ref 0.0–0.1)
Basophils Relative: 1 % (ref 0.0–3.0)
EOS ABS: 0.1 10*3/uL (ref 0.0–0.7)
Eosinophils Relative: 1.1 % (ref 0.0–5.0)
HEMATOCRIT: 40.8 % (ref 36.0–46.0)
Hemoglobin: 13.4 g/dL (ref 12.0–15.0)
LYMPHS ABS: 3.2 10*3/uL (ref 0.7–4.0)
LYMPHS PCT: 48.5 % — AB (ref 12.0–46.0)
MCHC: 32.9 g/dL (ref 30.0–36.0)
MCV: 87.3 fl (ref 78.0–100.0)
MONOS PCT: 5.3 % (ref 3.0–12.0)
Monocytes Absolute: 0.3 10*3/uL (ref 0.1–1.0)
NEUTROS ABS: 2.9 10*3/uL (ref 1.4–7.7)
NEUTROS PCT: 44.1 % (ref 43.0–77.0)
PLATELETS: 206 10*3/uL (ref 150.0–400.0)
RBC: 4.68 Mil/uL (ref 3.87–5.11)
RDW: 13.1 % (ref 11.5–15.5)
WBC: 6.5 10*3/uL (ref 4.0–10.5)

## 2018-09-27 LAB — C-REACTIVE PROTEIN: CRP: <0.1 mg/dL — ABNORMAL LOW (ref 0.5–20.0)

## 2018-09-27 LAB — SEDIMENTATION RATE: Sed Rate: 3 mm/hr (ref 0–20)

## 2018-09-27 NOTE — Patient Instructions (Signed)
 High-Protein and High-Calorie Diet Eating high-protein and high-calorie foods can help you to gain weight, heal after an injury, and recover after an illness or surgery. What is my plan? The specific amount of daily protein and calories you need depends on:  Your body weight.  The reason this diet is recommended for you.  Generally, a high-protein, high-calorie diet involves:  Eating 250-500 extra calories each day.  Making sure that 10-35% of your daily calories come from protein.  Talk to your health care provider about how much protein and how many calories you need each day. Follow the diet as directed by your health care provider. What do I need to know about this diet?  Ask your health care provider if you should take a nutritional supplement.  Try to eat six small meals each day instead of three large meals.  Eat a balanced diet, including one food that is high in protein at each meal.  Keep nutritious snacks handy, such as nuts, trail mixes, dried fruit, and yogurt.  If you have kidney disease or diabetes, eating too much protein may put extra stress on your kidneys. Talk to your health care provider if you have either of those conditions. What are some high-protein foods? Grains Quinoa. Bulgur wheat. Vegetables Soybeans. Peas. Meats and Other Protein Sources Beef, pork, and poultry. Fish and seafood. Eggs. Tofu. Textured vegetable protein (TVP). Peanut butter. Nuts and seeds. Dried beans. Protein powders. Dairy Whole milk. Whole-milk yogurt. Powdered milk. Cheese. Cottage Cheese. Eggnog. Beverages High-protein supplement drinks. Soy milk. Other Protein bars. The items listed above may not be a complete list of recommended foods or beverages. Contact your dietitian for more options. What are some high-calorie foods? Grains Pasta. Quick breads. Muffins. Pancakes. Ready-to-eat cereal. Vegetables Vegetables cooked in oil or butter. Fried potatoes. Fruits Dried  fruit. Fruit leather. Canned fruit in syrup. Fruit juice. Avocados. Meats and Other Protein Sources Peanut butter. Nuts and seeds. Dairy Heavy cream. Whipped cream. Cream cheese. Sour cream. Ice cream. Custard. Pudding. Beverages Meal-replacement beverages. Nutrition shakes. Fruit juice. Sugar-sweetened soft drinks. Condiments Salad dressing. Mayonnaise. Alfredo sauce. Fruit preserves or jelly. Honey. Syrup. Sweets/Desserts Cake. Cookies. Pie. Pastries. Candy bars. Chocolate. Fats and Oils Butter or margarine. Oil. Gravy. Other Meal-replacement bars. The items listed above may not be a complete list of recommended foods or beverages. Contact your dietitian for more options. What are some tips for including high-protein and high-calorie foods in my diet?  Add whole milk, half-and-half, or heavy cream to cereal, pudding, soup, or hot cocoa.  Add whole milk to instant breakfast drinks.  Add peanut butter to oatmeal or smoothies.  Add powdered milk to baked goods, smoothies, or milkshakes.  Add powdered milk, cream, or butter to mashed potatoes.  Add cheese to cooked vegetables.  Make whole-milk yogurt parfaits. Top them with granola, fruit, or nuts.  Add cottage cheese to your fruit.  Add avocados, cheese, or both to sandwiches or salads.  Add meat, poultry, or seafood to rice, pasta, casseroles, salads, and soups.  Use mayonnaise when making egg salad, chicken salad, or tuna salad.  Use peanut butter as a topping for pretzels, celery, or crackers.  Add beans to casseroles, dips, and spreads.  Add pureed beans to sauces and soups.  Replace calorie-free drinks with calorie-containing drinks, such as milk and fruit juice. This information is not intended to replace advice given to you by your health care provider. Make sure you discuss any questions you have with your   health care provider. Document Released: 11/14/2005 Document Revised: 04/21/2016 Document Reviewed:  04/29/2014 Elsevier Interactive Patient Education  2018 Elsevier Inc.  

## 2018-09-27 NOTE — Progress Notes (Signed)
Subjective:    Patient ID: Danielle Casey, female    DOB: [birth year], 27 y.o.   MRN: [id number]  HPI   Patient presents to clinic to follow up on her unexplained weight loss. Patient states the weight loss has been present since before August and also before she started Vyvanse. Patient states she consciously makes sure she eats and does not restrict herself from eating. States she eats foods high in protein and calories and is unsure why she is losing weight. States that she knows the Vyvanse is an appetite suppressant as well as used for ADHD, but does not believe she is eating less because of the medication.   Lab work was done in August 2019 and was unremarkable except for Vit D deficiency - patient has started supplement. TSH, CBC, CMP were normal. Hep C and HIV were negative.   Wt Readings from Last 3 Encounters:  09/27/18 87 lb 12.8 oz (39.8 kg)  08/17/18 91 lb (41.3 kg)  07/11/18 93 lb 4 oz (42.3 kg)   Patient Active Problem List   Diagnosis Date Noted  . Weight loss 07/11/2018  . Concentration deficit 07/11/2018  . Exposure to blood-borne pathogen 07/11/2018  . Screening for cervical cancer 02/13/2017  . Breast lump on right side at 2 o'clock position 01/03/2017  . Other chest pain 01/03/2017  . Contraception management 01/03/2017   Social History   Tobacco Use  . Smoking status: Current Every Day Smoker    Types: E-cigarettes  . Smokeless tobacco: Current User  . Tobacco comment: Juul regularly  Substance Use Topics  . Alcohol use: Yes    Alcohol/week: 1.0 - 2.0 standard drinks    Types: 1 - 2 Standard drinks or equivalent per week   Review of Systems  Constitutional: +fatigue. Negative for chills, and fever.  HENT: Negative for congestion, ear pain, sinus pain and sore throat.   Eyes: Negative.   Respiratory: Negative for cough, shortness of breath and wheezing.   Cardiovascular: Negative for chest pain, palpitations and leg swelling.  Gastrointestinal:  Negative for abdominal pain, diarrhea, nausea and vomiting.  Genitourinary: Negative for dysuria, frequency and urgency.  Musculoskeletal: Negative for arthralgias and myalgias.  Skin: Negative for color change, pallor and rash.  Neurological: Negative for syncope, light-headedness and headaches.  Psychiatric/Behavioral: The patient is not nervous/anxious.       Objective:   Physical Exam  Constitutional: She is oriented to person, place, and time. No distress.  Very thin female  HENT:  Head: Normocephalic and atraumatic.  Eyes: Conjunctivae and EOM are normal. No scleral icterus.  Neck: Neck supple. No tracheal deviation present.  Cardiovascular: Normal rate, regular rhythm and normal heart sounds.  Pulmonary/Chest: Effort normal and breath sounds normal. No respiratory distress.  Abdominal: Soft. Bowel sounds are normal.  Musculoskeletal: She exhibits no edema.  Gait normal  Neurological: She is alert and oriented to person, place, and time.  Skin: Skin is warm and dry. She is not diaphoretic. No pallor.  Psychiatric: She has a normal mood and affect. Her behavior is normal.  Nursing note and vitals reviewed.     Vitals:   09/27/18 1046 09/27/18 1100  BP: (!) 90/58 100/62  Pulse: 77   Temp: 98.4 F (36.9 C)   SpO2: 98%     Assessment & Plan:   Unexplained weight loss -- Endocrine referral placed by M.Arnett FNP and appt is pending at this time. We will get more lab work today including repeat CBC,  CMP, thyroid and also ANA and sed rate. Patient was unable to urinate, so we will place future order for for urinalysis.  Discussed that while Vyvanse may not be cause of weight loss but it could be making it worse and encouraged her to consider lessening the dose or stopping medication to see if any difference is made. Discussed eating a high calorie and high protein diet to promote weight gain: What are some tips for including high-protein and high-calorie foods in my  diet?  Add whole milk, half-and-half, or heavy cream to cereal, pudding, soup, or hot cocoa.  Add whole milk to instant breakfast drinks.  Add peanut butter to oatmeal or smoothies.  Add powdered milk to baked goods, smoothies, or milkshakes.  Add powdered milk, cream, or butter to mashed potatoes.  Add cheese to cooked vegetables.  Make whole-milk yogurt parfaits. Top them with granola, fruit, or nuts.  Add cottage cheese to your fruit.  Add avocados, cheese, or both to sandwiches or salads.  Add meat, poultry, or seafood to rice, pasta, casseroles, salads, and soups.  Use mayonnaise when making egg salad, chicken salad, or tuna salad.  Use peanut butter as a topping for pretzels, celery, or crackers.  Add beans to casseroles, dips, and spreads.  Add pureed beans to sauces and soups.  Replace calorie-free drinks with calorie-containing drinks, such as milk and fruit juice.    Patient aware that she will be contacted with information regarding endocrine referral.

## 2018-09-27 NOTE — Telephone Encounter (Signed)
Pt returned call. Pt says that she is waiting for Endocrine to call her to schedule.

## 2018-09-27 NOTE — Telephone Encounter (Signed)
Left voice mail for patient to call back ok for PEC to speak to patient , regarding below message    

## 2018-09-27 NOTE — Telephone Encounter (Signed)
Referral was sent to [facility] on 10/31. Referral was placed on 10/30. They will review her notes and give her a call to schedule.

## 2018-09-28 ENCOUNTER — Telehealth: Payer: Self-pay | Admitting: Family

## 2018-09-28 NOTE — Telephone Encounter (Signed)
Left message for pt to return call to the office. See result note. 

## 2018-09-28 NOTE — Telephone Encounter (Signed)
Patient returning a call to obtain lab results. Nurse triage currently unavailable. Please advise.    Copied from CRM [id number]. Topic: Quick Communication - Lab Results (Clinic Use ONLY) >> Sep 28, 2018  2:22 PM Clearnce Sorrel, [place] wrote: Called patient to inform them of 09/27/2018 lab results. When patient returns call, triage nurse may disclose results.

## 2018-09-29 LAB — THYROID PANEL WITH TSH
FREE THYROXINE INDEX: 2.9 (ref 1.4–3.8)
T3 Uptake: 26 % (ref 22–35)
T4 TOTAL: 11.1 ug/dL (ref 5.1–11.9)
TSH: 2.57 mIU/L

## 2018-09-29 LAB — ANA: ANA: NEGATIVE

## 2018-10-02 NOTE — Telephone Encounter (Signed)
Left voicemail for patient to call . Please advise

## 2018-10-04 NOTE — Telephone Encounter (Signed)
Nate from [facility] calling.  States that Dr. Rosaland Lao reviewed notes and based off of lab results - normal TSH - they would like additional testing to determine if this is truly an endocrine issue before accepting referral. If labs show endocrine issue please re-refer.

## 2018-10-04 NOTE — Telephone Encounter (Signed)
Please advise 

## 2018-10-10 NOTE — Telephone Encounter (Signed)
Please call pt Advise her of what endocrine called and stated  How is her weight ? Has she lost any more weight?

## 2018-10-12 NOTE — Telephone Encounter (Signed)
Left voicemail for patient to call, ok for PEC to speak with patient

## 2018-10-16 NOTE — Telephone Encounter (Signed)
Pt has not been contacted by Endo. I have provided her the ph # for [facility]. Pt will call.   Pt states no additional weight loss since visit with Claris CheMargaret.   Call back # if needed [phone]

## 2018-10-16 NOTE — Telephone Encounter (Signed)
FYI

## 2018-10-17 ENCOUNTER — Telehealth: Payer: Self-pay

## 2018-10-17 NOTE — Telephone Encounter (Signed)
Copied from CRM #[id number]. Topic: Referral - Status >> Oct 17, 2018  2:10 PM Alexander, Amber L wrote: Reason for CRM:   Pt calling in and states that she got a call from endocrinology who denied her referral.  Pt wants to know what she should do at this point. Pt can be reached at [phone] 

## 2018-10-17 NOTE — Telephone Encounter (Signed)
Left voicemail to call , why did endocrinology deny referral

## 2018-10-17 NOTE — Telephone Encounter (Signed)
Spoke with patient she states [facility] denied referral after reviewing labs . They stated there was no reason to to see patient

## 2018-10-17 NOTE — Telephone Encounter (Signed)
Copied from CRM [id number]. Topic: Referral - Status >> Oct 17, 2018  2:10 PM Baldo DaubAlexander, Amber L wrote: Reason for CRM:   Pt calling in and states that she got a call from endocrinology who denied her referral.  Pt wants to know what she should do at this point. Pt can be reached at [phone]

## 2018-10-17 NOTE — Telephone Encounter (Signed)
Call pt  Endocrine has denied referral for weight loss as her TSH is normal and doesn't appear to be an endocrine issue at this time.   I have not placed a referral to oncology at this time as we had agreed endocrine was an appropriate place to start. However , I can go ahead and place referral to oncology as I dont think that is unreasonable at this time.   Let pt know that  I am increasingly concerned with her weight loss. I would STRONGLY  encourage her to stop the Vyvanse as without a doubt, I think it contributory.   [facility] and would [facility] about [facility].   Let me know about oncology referral

## 2018-10-18 NOTE — Telephone Encounter (Signed)
Patient advised of below and verbalized understanding.  She would like to speak with Alvis Lemmingsawn ,psychologist about stopping Vyvanse .  However she is willing to do your recommendations on stopping the Vyvanse. She has follow up appointment with you 10/24/18

## 2018-10-19 ENCOUNTER — Encounter: Payer: Self-pay | Admitting: Family

## 2018-10-19 NOTE — Telephone Encounter (Signed)
Noted.   We can discuss at CPE

## 2018-10-19 NOTE — Progress Notes (Signed)
Subjective:    Patient ID: Danielle Casey, female    DOB: 08/04/1991, 27 y.o.   MRN: [id number]  CC: Danielle Casey is a 27 y.o. female who presents today for physical exam.    HPI: Accompanied by mom today.   OCP- has been off OCP for couple of months; thinks OCP helps with weight.   ADHD- Considering Trial of holding vyvanse due to weight loss however would like to speak with Dawn first   No bone pain, night sweats.      Colorectal Cancer Screening: No early family history.  Breast Cancer Screening: strong family history; 2 years ago had US breasts, states benign cyst in left breast.  Cervical Cancer Screening: due; declines. On cycle today        Tetanus - UTD  Labs: Screening labs done prior; declines checking vitamin d today, on supplement. Exercise: Gets regular exercise.  Alcohol use: Occasional. Smoking/tobacco use: Nonsmoker.  Regular dental exams:UTD Wears seat belt: Yes.  HISTORY:  Past Medical History:  Diagnosis Date  . Migraines     Past Surgical History:  Procedure Laterality Date  . NO PAST SURGERIES     Family History  Problem Relation Age of Onset  . Breast cancer Mother 23  . Mental illness Mother   . Cervical cancer Mother   . Breast cancer Maternal Grandmother 40  . Arthritis Maternal Grandmother   . Hypertension Maternal Grandmother   . Diabetes Maternal Grandmother   . Mental illness Maternal Grandmother   . Colon cancer Maternal Uncle        unsure of age  . Thyroid cancer Maternal Uncle 45  . Cervical cancer Sister 72      ALLERGIES: Patient has no known allergies.  Current Outpatient Medications on File Prior to Visit  Medication Sig Dispense Refill  . lisdexamfetamine (VYVANSE) 30 MG capsule Take 1 capsule (30 mg total) by mouth daily. 30 capsule 0  . norgestimate-ethinyl estradiol (SPRINTEC 28) 0.25-35 MG-MCG tablet Take 1 tablet by mouth daily. 3 Package 4   No current facility-administered medications on file prior  to visit.     Social History   Tobacco Use  . Smoking status: Current Every Day Smoker    Types: E-cigarettes  . Smokeless tobacco: Current User  . Tobacco comment: Juul regularly  Substance Use Topics  . Alcohol use: Yes    Alcohol/week: 1.0 - 2.0 standard drinks    Types: 1 - 2 Standard drinks or equivalent per week    Comment: occasional, once per month  . Drug use: No    Review of Systems  Constitutional: Positive for unexpected weight change. Negative for chills and fever.  HENT: Negative for congestion.   Respiratory: Negative for cough.   Cardiovascular: Negative for chest pain, palpitations and leg swelling.  Gastrointestinal: Negative for nausea and vomiting.  Genitourinary: Positive for vaginal bleeding. Negative for pelvic pain and vaginal discharge.  Musculoskeletal: Negative for arthralgias and myalgias.  Skin: Negative for rash.  Neurological: Negative for headaches.  Hematological: Negative for adenopathy.  Psychiatric/Behavioral: Negative for confusion.      Objective:    BP 102/70 (BP Location: Right Arm, Patient Position: Sitting, Cuff Size: Normal)   Pulse 82   Ht 5' (1.524 m)   Wt 87 lb 6.4 oz (39.6 kg)   LMP 10/21/2018 (Exact Date)   SpO2 98%   BMI 17.07 kg/m   BP Readings from Last 3 Encounters:  10/24/18 102/70  09/27/18 100/62  08/17/18 100/70   Wt Readings from Last 3 Encounters:  10/24/18 87 lb 6.4 oz (39.6 kg)  09/27/18 87 lb 12.8 oz (39.8 kg)  08/17/18 91 lb (41.3 kg)    Physical Exam  Constitutional: She appears well-developed and well-nourished.  Eyes: Conjunctivae are normal.  Neck: No thyroid mass and no thyromegaly present.  Cardiovascular: Normal rate, regular rhythm, normal heart sounds and normal pulses.  Pulmonary/Chest: Effort normal and breath sounds normal. She has no wheezes. She has no rhonchi. She has no rales. Right breast exhibits mass (6 oclock). Right breast exhibits no inverted nipple, no nipple discharge, no  skin change and no tenderness. Left breast exhibits mass (3 oclock). Left breast exhibits no inverted nipple, no nipple discharge, no skin change and no tenderness. Breasts are symmetrical.  CBE performed.   Lymphadenopathy:       Head (right side): No submental, no submandibular, no tonsillar, no preauricular, no posterior auricular and no occipital adenopathy present.       Head (left side): No submental, no submandibular, no tonsillar, no preauricular, no posterior auricular and no occipital adenopathy present.    She has no cervical adenopathy.       Right cervical: No superficial cervical, no deep cervical and no posterior cervical adenopathy present.      Left cervical: No superficial cervical, no deep cervical and no posterior cervical adenopathy present.    She has no axillary adenopathy.  Neurological: She is alert.  Skin: Skin is warm and dry.  Psychiatric: She has a normal mood and affect. Her speech is normal and behavior is normal. Thought content normal.  Vitals reviewed.      Assessment & Plan:   Problem List Items Addressed This Visit      Other   Weight loss    Concerned. BMI 17. Advised trial stop vyvanse; she will discuss with psyhiatric NP, dawn.  Meanwhile pending referral to oncology. Will follow.       Relevant Orders   Ambulatory referral to Hematology / Oncology   Family history of breast cancer    Breast masess felt bilaterally. Pending diagnostic mammogram and BL ultrasounds. Referral to oncology for lifetime risk.       Relevant Orders   US BREAST LTD UNI LEFT INC AXILLA   MM DIAG BREAST TOMO BILATERAL   US BREAST LTD UNI RIGHT INC AXILLA   Routine physical examination - Primary    Clinical breast exam performed today.  Patient has dense breast, masses appreciated bilaterally.  Pending diagnostic mammogram, bilateral ultrasound.  Also pending consult with oncology for genetics counseling.  Will return for pap smear.  Advised against using vape.       Relevant Orders   Ambulatory referral to Hematology / Oncology       I am having Danielle MargaritaIsabel G. Casey maintain her norgestimate-ethinyl estradiol and lisdexamfetamine.   No orders of the defined types were placed in this encounter.   Return precautions given.   Risks, benefits, and alternatives of the medications and treatment plan prescribed today were discussed, and patient expressed understanding.   Education regarding symptom management and diagnosis given to patient on AVS.   Continue to follow with Allegra GranaArnett, Brayla Pat G, FNP for routine health maintenance.   Danielle MargaritaIsabel G Casey and I agreed with plan.   Rennie PlowmanMargaret Zykeriah Mathia, FNP

## 2018-10-24 ENCOUNTER — Encounter: Payer: Self-pay | Admitting: Family

## 2018-10-24 ENCOUNTER — Ambulatory Visit (INDEPENDENT_AMBULATORY_CARE_PROVIDER_SITE_OTHER): Payer: BLUE CROSS/BLUE SHIELD | Admitting: Family

## 2018-10-24 ENCOUNTER — Telehealth: Payer: Self-pay

## 2018-10-24 VITALS — BP 102/70 | HR 82 | Ht 60.0 in | Wt 87.4 lb

## 2018-10-24 DIAGNOSIS — Z803 Family history of malignant neoplasm of breast: Secondary | ICD-10-CM | POA: Insufficient documentation

## 2018-10-24 DIAGNOSIS — Z0000 Encounter for general adult medical examination without abnormal findings: Secondary | ICD-10-CM | POA: Diagnosis not present

## 2018-10-24 DIAGNOSIS — R634 Abnormal weight loss: Secondary | ICD-10-CM

## 2018-10-24 NOTE — Assessment & Plan Note (Signed)
Breast masess felt bilaterally. Pending diagnostic mammogram and BL ultrasounds. Referral to oncology for lifetime risk.

## 2018-10-24 NOTE — Patient Instructions (Addendum)
Today we discussed referrals, orders. Oncology for genetics and weight loss; breast ultrasounds and diagnostic mammogram    I have placed these orders in the system for you.  Please be sure to give [place] a call if you have not heard from our office regarding this. We should hear from [place] within ONE week with information regarding your appointment. If not, please let me know immediately.   Bring family history to oncology appointment  Health Maintenance, Female Adopting a healthy lifestyle and getting preventive care can go a long way to promote health and wellness. Talk with your health care provider about what schedule of regular examinations is right for you. This is a good chance for you to check in with your provider about disease prevention and staying healthy. In between checkups, there are plenty of things you can do on your own. Experts have done a lot of research about which lifestyle changes and preventive measures are most likely to keep you healthy. Ask your health care provider for more information. Weight and diet Eat a healthy diet  Be sure to include plenty of vegetables, fruits, low-fat dairy products, and lean protein.  Do not eat a lot of foods high in solid fats, added sugars, or salt.  Get regular exercise. This is one of the most important things you can do for your health. ? Most adults should exercise for at least 150 minutes each week. The exercise should increase your heart rate and make you sweat (moderate-intensity exercise). ? Most adults should also do strengthening exercises at least twice a week. This is in addition to the moderate-intensity exercise.  Maintain a healthy weight  Body mass index (BMI) is a measurement that can be used to identify possible weight problems. It estimates body fat based on height and weight. Your health care provider can help determine your BMI and help you achieve or maintain a healthy weight.  For females 65 years of age and  older: ? A BMI below 18.5 is considered underweight. ? A BMI of 18.5 to 24.9 is normal. ? A BMI of 25 to 29.9 is considered overweight. ? A BMI of 30 and above is considered obese.  Watch levels of cholesterol and blood lipids  You should start having your blood tested for lipids and cholesterol at 29 years of age, then have this test every 5 years.  You may need to have your cholesterol levels checked more often if: ? Your lipid or cholesterol levels are high. ? You are older than 29 years of age. ? You are at high risk for heart disease.  Cancer screening Lung Cancer  Lung cancer screening is recommended for adults 40-24 years old who are at high risk for lung cancer because of a history of smoking.  A yearly low-dose CT scan of the lungs is recommended for people who: ? Currently smoke. ? Have quit within the past 15 years. ? Have at least a 30-pack-year history of smoking. A pack year is smoking an average of one pack of cigarettes a day for 1 year.  Yearly screening should continue until it has been 15 years since you quit.  Yearly screening should stop if you develop a health problem that would prevent you from having lung cancer treatment.  Breast Cancer  Practice breast self-awareness. This means understanding how your breasts normally appear and feel.  It also means doing regular breast self-exams. Let your health care provider know about any changes, no matter how small.  If  you are in your 20s or 30s, you should have a clinical breast exam (CBE) by a health care provider every 1-3 years as part of a regular health exam.  If you are 1 or older, have a CBE every year. Also consider having a breast X-ray (mammogram) every year.  If you have a family history of breast cancer, talk to your health care provider about genetic screening.  If you are at high risk for breast cancer, talk to your health care provider about having an MRI and a mammogram every year.  Breast  cancer gene (BRCA) assessment is recommended for women who have family members with BRCA-related cancers. BRCA-related cancers include: ? Breast. ? Ovarian. ? Tubal. ? Peritoneal cancers.  Results of the assessment will determine the need for genetic counseling and BRCA1 and BRCA2 testing.  Cervical Cancer Your health care provider may recommend that you be screened regularly for cancer of the pelvic organs (ovaries, uterus, and vagina). This screening involves a pelvic examination, including checking for microscopic changes to the surface of your cervix (Pap test). You may be encouraged to have this screening done every 3 years, beginning at age 6.  For women ages 55-65, health care providers may recommend pelvic exams and Pap testing every 3 years, or they may recommend the Pap and pelvic exam, combined with testing for human papilloma virus (HPV), every 5 years. Some types of HPV increase your risk of cervical cancer. Testing for HPV may also be done on women of any age with unclear Pap test results.  Other health care providers may not recommend any screening for nonpregnant women who are considered low risk for pelvic cancer and who do not have symptoms. Ask your health care provider if a screening pelvic exam is right for you.  If you have had past treatment for cervical cancer or a condition that could lead to cancer, you need Pap tests and screening for cancer for at least 20 years after your treatment. If Pap tests have been discontinued, your risk factors (such as having a new sexual partner) need to be reassessed to determine if screening should resume. Some women have medical problems that increase the chance of getting cervical cancer. In these cases, your health care provider may recommend more frequent screening and Pap tests.  Colorectal Cancer  This type of cancer can be detected and often prevented.  Routine colorectal cancer screening usually begins at 29 years of age and  continues through 28 years of age.  Your health care provider may recommend screening at an earlier age if you have risk factors for colon cancer.  Your health care provider may also recommend using home test kits to check for hidden blood in the stool.  A small camera at the end of a tube can be used to examine your colon directly (sigmoidoscopy or colonoscopy). This is done to check for the earliest forms of colorectal cancer.  Routine screening usually begins at age 51.  Direct examination of the colon should be repeated every 5-10 years through 29 years of age. However, you may need to be screened more often if early forms of precancerous polyps or small growths are found.  Skin Cancer  Check your skin from head to toe regularly.  Tell your health care provider about any new moles or changes in moles, especially if there is a change in a mole's shape or color.  Also tell your health care provider if you have a mole that is larger  than the size of a pencil eraser.  Always use sunscreen. Apply sunscreen liberally and repeatedly throughout the day.  Protect yourself by wearing long sleeves, pants, a wide-brimmed hat, and sunglasses whenever you are outside.  Heart disease, diabetes, and high blood pressure  High blood pressure causes heart disease and increases the risk of stroke. High blood pressure is more likely to develop in: ? People who have blood pressure in the high end of the normal range (130-139/85-89 mm Hg). ? People who are overweight or obese. ? People who are African American.  If you are 38-4 years of age, have your blood pressure checked every 3-5 years. If you are 53 years of age or older, have your blood pressure checked every year. You should have your blood pressure measured twice-once when you are at a hospital or clinic, and once when you are not at a hospital or clinic. Record the average of the two measurements. To check your blood pressure when you are not  at a hospital or clinic, you can use: ? An automated blood pressure machine at a pharmacy. ? A home blood pressure monitor.  If you are between 47 years and 75 years old, ask your health care provider if you should take aspirin to prevent strokes.  Have regular diabetes screenings. This involves taking a blood sample to check your fasting blood sugar level. ? If you are at a normal weight and have a low risk for diabetes, have this test once every three years after 29 years of age. ? If you are overweight and have a high risk for diabetes, consider being tested at a younger age or more often. Preventing infection Hepatitis B  If you have a higher risk for hepatitis B, you should be screened for this virus. You are considered at high risk for hepatitis B if: ? You were born in a country where hepatitis B is common. Ask your health care provider which countries are considered high risk. ? Your parents were born in a high-risk country, and you have not been immunized against hepatitis B (hepatitis B vaccine). ? You have HIV or AIDS. ? You use needles to inject street drugs. ? You live with someone who has hepatitis B. ? You have had sex with someone who has hepatitis B. ? You get hemodialysis treatment. ? You take certain medicines for conditions, including cancer, organ transplantation, and autoimmune conditions.  Hepatitis C  Blood testing is recommended for: ? Everyone born from [birth year] through 1967. ? Anyone with known risk factors for hepatitis C.  Sexually transmitted infections (STIs)  You should be screened for sexually transmitted infections (STIs) including gonorrhea and chlamydia if: ? You are sexually active and are younger than 29 years of age. ? You are older than 29 years of age and your health care provider tells you that you are at risk for this type of infection. ? Your sexual activity has changed since you were last screened and you are at an increased risk for chlamydia  or gonorrhea. Ask your health care provider if you are at risk.  If you do not have HIV, but are at risk, it may be recommended that you take a prescription medicine daily to prevent HIV infection. This is called pre-exposure prophylaxis (PrEP). You are considered at risk if: ? You are sexually active and do not regularly use condoms or know the HIV status of your partner(s). ? You take drugs by injection. ? You are sexually active with  a partner who has HIV.  Talk with your health care provider about whether you are at high risk of being infected with HIV. If you choose to begin PrEP, you should first be tested for HIV. You should then be tested every 3 months for as long as you are taking PrEP. Pregnancy  If you are premenopausal and you may become pregnant, ask your health care provider about preconception counseling.  If you may become pregnant, take 400 to 800 micrograms (mcg) of folic acid every day.  If you want to prevent pregnancy, talk to your health care provider about birth control (contraception). Osteoporosis and menopause  Osteoporosis is a disease in which the bones lose minerals and strength with aging. This can result in serious bone fractures. Your risk for osteoporosis can be identified using a bone density scan.  If you are 18 years of age or older, or if you are at risk for osteoporosis and fractures, ask your health care provider if you should be screened.  Ask your health care provider whether you should take a calcium or vitamin D supplement to lower your risk for osteoporosis.  Menopause may have certain physical symptoms and risks.  Hormone replacement therapy may reduce some of these symptoms and risks. Talk to your health care provider about whether hormone replacement therapy is right for you. Follow these instructions at home:  Schedule regular health, dental, and eye exams.  Stay current with your immunizations.  Do not use any tobacco products  including cigarettes, chewing tobacco, or electronic cigarettes.  If you are pregnant, do not drink alcohol.  If you are breastfeeding, limit how much and how often you drink alcohol.  Limit alcohol intake to no more than 1 drink per day for nonpregnant women. One drink equals 12 ounces of beer, 5 ounces of wine, or 1 ounces of hard liquor.  Do not use street drugs.  Do not share needles.  Ask your health care provider for help if you need support or information about quitting drugs.  Tell your health care provider if you often feel depressed.  Tell your health care provider if you have ever been abused or do not feel safe at home. This information is not intended to replace advice given to you by your health care provider. Make sure you discuss any questions you have with your health care provider. Document Released: 05/30/2011 Document Revised: 04/21/2016 Document Reviewed: 08/18/2015 Elsevier Interactive Patient Education  [employer].

## 2018-10-24 NOTE — Telephone Encounter (Signed)
Left message for patient to call  [facility] 11/04/18 @9 :20 am

## 2018-10-24 NOTE — Assessment & Plan Note (Addendum)
Concerned. BMI 17. Advised trial stop vyvanse; she will discuss with psyhiatric NP, dawn.  Meanwhile pending referral to oncology. Will follow.

## 2018-10-24 NOTE — Assessment & Plan Note (Addendum)
Clinical breast exam performed today.  Patient has dense breast, masses appreciated bilaterally.  Pending diagnostic mammogram, bilateral ultrasound.  Also pending consult with oncology for genetics counseling.  Will return for pap smear.  Advised against using vape.

## 2018-11-01 ENCOUNTER — Other Ambulatory Visit: Payer: Self-pay

## 2018-11-18 NOTE — Progress Notes (Deleted)
[facility]  Telephone:(336) [phone] [phone]) [phone]  ID: Sueanne Margaritasabel G Porada OB: 1991/07/31  MR#: [id number]  AOZ#:[id number]#:[id number]  Patient Care Team: Allegra GranaArnett, Margaret G, FNP as PCP - General (Family Medicine)  CHIEF COMPLAINT: Unintentional weight loss  INTERVAL HISTORY: ***  REVIEW OF SYSTEMS:   ROS  As per HPI. Otherwise, a complete review of systems is negative.  PAST MEDICAL HISTORY: Past Medical History:  Diagnosis Date  . Migraines     PAST SURGICAL HISTORY: Past Surgical History:  Procedure Laterality Date  . NO PAST SURGERIES      FAMILY HISTORY: Family History  Problem Relation Age of Onset  . Breast cancer Mother 3020  . Mental illness Mother   . Cervical cancer Mother   . Breast cancer Maternal Grandmother 40  . Arthritis Maternal Grandmother   . Hypertension Maternal Grandmother   . Diabetes Maternal Grandmother   . Mental illness Maternal Grandmother   . Colon cancer Maternal Uncle        unsure of age  . Thyroid cancer Maternal Uncle 45  . Cervical cancer Sister 620    ADVANCED DIRECTIVES (Y/N):  N  HEALTH MAINTENANCE: Social History   Tobacco Use  . Smoking status: Current Every Day Smoker    Types: E-cigarettes  . Smokeless tobacco: Current User  . Tobacco comment: Juul regularly  Substance Use Topics  . Alcohol use: Yes    Alcohol/week: 1.0 - 2.0 standard drinks    Types: 1 - 2 Standard drinks or equivalent per week    Comment: occasional, once per month  . Drug use: No     Colonoscopy:  PAP:  Bone density:  Lipid panel:  No Known Allergies  Current Outpatient Medications  Medication Sig Dispense Refill  . lisdexamfetamine (VYVANSE) 30 MG capsule Take 1 capsule (30 mg total) by mouth daily. 30 capsule 0  . norgestimate-ethinyl estradiol (SPRINTEC 28) 0.25-35 MG-MCG tablet Take 1 tablet by mouth daily. 3 Package 4   No current facility-administered medications for this visit.     OBJECTIVE: There were no  vitals filed for this visit.   There is no height or weight on file to calculate BMI.    ECOG FS:{CHL ONC [phone]}  General: Well-developed, well-nourished, no acute distress. Eyes: Pink conjunctiva, anicteric sclera. HEENT: Normocephalic, moist mucous membranes, clear oropharnyx. Lungs: Clear to auscultation bilaterally. Heart: Regular rate and rhythm. No rubs, murmurs, or gallops. Abdomen: Soft, nontender, nondistended. No organomegaly noted, normoactive bowel sounds. Musculoskeletal: No edema, cyanosis, or clubbing. Neuro: Alert, answering all questions appropriately. Cranial nerves grossly intact. Skin: No rashes or petechiae noted. Psych: Normal affect. Lymphatics: No cervical, calvicular, axillary or inguinal LAD.   LAB RESULTS:  Lab Results  Component Value Date   NA 139 09/27/2018   K 3.9 09/27/2018   CL 104 09/27/2018   CO2 30 09/27/2018   GLUCOSE 81 09/27/2018   BUN 14 09/27/2018   CREATININE 0.80 09/27/2018   CALCIUM 9.2 09/27/2018   PROT 7.2 09/27/2018   ALBUMIN 4.4 09/27/2018   AST 17 09/27/2018   ALT 14 09/27/2018   ALKPHOS 58 09/27/2018   BILITOT 0.9 09/27/2018   GFRNONAA 121 01/03/2017   GFRAA 140 01/03/2017    Lab Results  Component Value Date   WBC 6.5 09/27/2018   NEUTROABS 2.9 09/27/2018   HGB 13.4 09/27/2018   HCT 40.8 09/27/2018   MCV 87.3 09/27/2018   PLT 206.0 09/27/2018     STUDIES: No results found.  ASSESSMENT: Unintentional  weight loss  PLAN:    1. Unintentional weight loss:  Patient expressed understanding and was in agreement with this plan. She also understands that She can call clinic at any time with any questions, concerns, or complaints.   Cancer Staging No matching staging information was found for the patient.  Jeralyn Ruthsimothy J Finnegan, MD   11/18/2018 9:21 AM

## 2018-11-19 ENCOUNTER — Inpatient Hospital Stay: Payer: Self-pay | Admitting: Oncology

## 2018-12-03 ENCOUNTER — Telehealth: Payer: Self-pay | Admitting: Family

## 2018-12-03 NOTE — Telephone Encounter (Signed)
call patient She is due for diagnostic mammogram as we discussed in office CPE   Please schedule this for her.  Has patient made appointment with genetics yet?

## 2018-12-03 NOTE — Telephone Encounter (Signed)
LMTCB. I gave patient number to [place] to schedule mammogram & I needed to know if she has made appointment with genetics.

## 2018-12-03 NOTE — Telephone Encounter (Signed)
Pt is returning sarah call and has a question concerning another issue. Pt did not elaborate the issue

## 2018-12-04 NOTE — Telephone Encounter (Signed)
LM again for patient to call back

## 2018-12-04 NOTE — Telephone Encounter (Signed)
Pt requesting a call back from Sarah. She states she works 2nd shift and cannot answer after 2:30pm and she also does not wake up in the morning until after 10am.

## 2018-12-05 NOTE — Telephone Encounter (Signed)
The patient can call Access one @ [phone] to inquire about payment options.

## 2018-12-05 NOTE — Telephone Encounter (Signed)
LMTCB

## 2018-12-05 NOTE — Telephone Encounter (Signed)
Patient has was unable to afford health insurance this year & was inquiring about self pay options? I was unsure on what to advise & if we had any idea of cost? Can you help me out with this so patient can continue to see Danielle Casey this year?

## 2018-12-07 NOTE — Telephone Encounter (Signed)
Left patient detailed VM with access one number. I asked if she had any further questions to call office.

## 2018-12-17 ENCOUNTER — Other Ambulatory Visit: Payer: Self-pay | Admitting: Family

## 2018-12-17 ENCOUNTER — Telehealth: Payer: Self-pay

## 2018-12-17 DIAGNOSIS — N6312 Unspecified lump in the right breast, upper inner quadrant: Secondary | ICD-10-CM

## 2018-12-17 NOTE — Telephone Encounter (Signed)
noted 

## 2018-12-25 ENCOUNTER — Institutional Professional Consult (permissible substitution): Payer: BLUE CROSS/BLUE SHIELD | Admitting: Family

## 2019-04-19 ENCOUNTER — Other Ambulatory Visit: Payer: Self-pay

## 2019-04-24 ENCOUNTER — Ambulatory Visit: Payer: Self-pay | Admitting: Family

## 2019-08-07 ENCOUNTER — Telehealth: Payer: Self-pay | Admitting: Family

## 2019-10-28 ENCOUNTER — Encounter: Payer: Self-pay | Admitting: Family

## 2019-12-13 ENCOUNTER — Telehealth: Payer: Self-pay | Admitting: Family

## 2019-12-13 NOTE — Telephone Encounter (Signed)
She had appointment already scheduled for 01/03/20. Do you want me to schedule CPE later maybe summer after f/u?

## 2019-12-13 NOTE — Telephone Encounter (Signed)
Call Reviewing patient's chart, I do not see that she ever had diagnostic breast ultrasound or mammogram.  She also meets criteria for annual breast MRI at age 29  Please have her schedule follow-up CPE with me so we can discuss all. She is due for PAP so I would sch CPE in couple of months when covid hopefully has decreased.

## 2019-12-16 NOTE — Telephone Encounter (Signed)
Yes, may schedule cpe in spring/summer

## 2019-12-17 NOTE — Telephone Encounter (Signed)
Will scheduled when I speak with patient on 01/03/20.

## 2019-12-26 NOTE — Progress Notes (Signed)
Virtual Visit via Video Note  I connected with@  on 01/03/20 at 11:30 AM EST by a video enabled telemedicine application and verified that I am speaking with the correct person using two identifiers.  Location patient: home Location provider:work  Persons participating in the virtual visit: patient, provider  I discussed the limitations of evaluation and management by telemedicine and the availability of in person appointments. The patient expressed understanding and agreed to proceed.   HPI: Follow up.  Doing well 'doing great'. Weight is stable, around 97lbs which is 'normal and always has been '  No bone pain, night sweats.  Eating regular means.  She notes that she has some increased anxiety of late. Back in school and working in [employer] 3rd shift, 10 hour shift.  In a few weeks, she will start working day shifts which she thinks to be very helpful for anxiety and sleep. No si/hi Family is well. OCP- ADHD- hasnt been on vyvanse for the last year. Hasnt missed medication and doesn't think she needs anymore.   OCP- compliant with medication. No family h/o DVT. Normal monthly menses.  No history of DVT, migraine with aura. No history of cancer. Patient states she's not pregnant or breast-feeding.No concern for STDs.     Didn't have health insurance last year and never had diagnostic breast ultrasound or mammogram.  She also meets criteria for annual breast MRI at age 47   CPE due  ROS: See pertinent positives and negatives per HPI.  Past Medical History:  Diagnosis Date  . Migraines     Past Surgical History:  Procedure Laterality Date  . NO PAST SURGERIES      Family History  Problem Relation Age of Onset  . Breast cancer Mother 61  . Mental illness Mother   . Cervical cancer Mother   . Breast cancer Maternal Grandmother 40  . Arthritis Maternal Grandmother   . Hypertension Maternal Grandmother   . Diabetes Maternal Grandmother   . Mental illness Maternal  Grandmother   . Colon cancer Maternal Uncle        unsure of age  . Thyroid cancer Maternal Uncle 45  . Cervical cancer Sister 42  . Clotting disorder Neg Hx     SOCIAL HX: former smoker   Current Outpatient Medications:  .  norgestimate-ethinyl estradiol (SPRINTEC 28) 0.25-35 MG-MCG tablet, Take 1 tablet by mouth daily., Disp: 3 Package, Rfl: 4  EXAM:  VITALS per patient if applicable:  GENERAL: alert, oriented, appears well and in no acute distress  HEENT: atraumatic, conjunttiva clear, no obvious abnormalities on inspection of external nose and ears  NECK: normal movements of the head and neck  LUNGS: on inspection no signs of respiratory distress, breathing rate appears normal, no obvious gross SOB, gasping or wheezing  CV: no obvious cyanosis  MS: moves all visible extremities without noticeable abnormality  PSYCH/NEURO: pleasant and cooperative, no obvious depression or anxiety, speech and thought processing grossly intact  ASSESSMENT AND PLAN:  Discussed the following assessment and plan:  Family history of breast cancer - Plan: MM 3D SCREEN BREAST BILATERAL, MR BREAST BILATERAL W WO CONTRAST INC CAD  Anxiety  Encounter for initial prescription of contraceptive pills - Plan: norgestimate-ethinyl estradiol (SPRINTEC 28) 0.25-35 MG-MCG tablet  Weight loss  Concentration deficit Problem List Items Addressed This Visit      Other   Anxiety    Politely declines counseling or medication at this time for this.  She will let me know how  she is doing      Concentration deficit    NO longer on vyvanse      Contraception management    clarified today that no personal or family history of blood clots.  Patient has no contraindications to oral birth control at this time.  We will continue and she will return for cpe, pap      Relevant Medications   norgestimate-ethinyl estradiol ([place] 28) 0.25-35 MG-MCG tablet   Family history of breast cancer - Primary     Greater than 20% lifetime risk.  Ordered annual mammogram and MRI  patient will schedule MM at [facility], number given today.         Relevant Orders   MM 3D SCREEN BREAST BILATERAL   MR BREAST BILATERAL W WO CONTRAST INC CAD   Weight loss    No further weight loss.  Patient states that she feels well and states patient weight is at her baseline.         -we discussed possible serious and likely etiologies, options for evaluation and workup, limitations of telemedicine visit vs in person visit, treatment, treatment risks and precautions. Pt prefers to treat via telemedicine empirically rather then risking or undertaking an in person visit at this moment. Patient agrees to seek prompt in person care if worsening, new symptoms arise, or if is not improving with treatment.   I discussed the assessment and treatment plan with the patient. The patient was provided an opportunity to ask questions and all were answered. The patient agreed with the plan and demonstrated an understanding of the instructions.   The patient was advised to call back or seek an in-person evaluation if the symptoms worsen or if the condition fails to improve as anticipated.   Mable Paris, FNP

## 2020-01-03 ENCOUNTER — Other Ambulatory Visit: Payer: Self-pay

## 2020-01-03 ENCOUNTER — Encounter: Payer: Self-pay | Admitting: Family

## 2020-01-03 ENCOUNTER — Ambulatory Visit (INDEPENDENT_AMBULATORY_CARE_PROVIDER_SITE_OTHER): Payer: Self-pay | Admitting: Family

## 2020-01-03 VITALS — Ht 61.0 in | Wt 100.0 lb

## 2020-01-03 DIAGNOSIS — Z803 Family history of malignant neoplasm of breast: Secondary | ICD-10-CM

## 2020-01-03 DIAGNOSIS — R634 Abnormal weight loss: Secondary | ICD-10-CM

## 2020-01-03 DIAGNOSIS — F32A Depression, unspecified: Secondary | ICD-10-CM | POA: Insufficient documentation

## 2020-01-03 DIAGNOSIS — R41840 Attention and concentration deficit: Secondary | ICD-10-CM

## 2020-01-03 DIAGNOSIS — F419 Anxiety disorder, unspecified: Secondary | ICD-10-CM

## 2020-01-03 DIAGNOSIS — Z30011 Encounter for initial prescription of contraceptive pills: Secondary | ICD-10-CM

## 2020-01-03 MED ORDER — NORGESTIMATE-ETH ESTRADIOL 0.25-35 MG-MCG PO TABS: 1.0000 | ORAL_TABLET | Freq: Every day | ORAL | 4 refills | Status: DC

## 2020-01-03 NOTE — Assessment & Plan Note (Signed)
NO longer on vyvanse

## 2020-01-03 NOTE — Assessment & Plan Note (Addendum)
clarified today that no personal or family history of blood clots.  Patient has no contraindications to oral birth control at this time.  We will continue and she will return for cpe, pap

## 2020-01-03 NOTE — Assessment & Plan Note (Addendum)
Greater than 20% lifetime risk.  Ordered annual mammogram and MRI  patient will schedule MM at [facility], number given today.

## 2020-01-03 NOTE — Assessment & Plan Note (Signed)
Politely declines counseling or medication at this time for this.  She will let me know how she is doing

## 2020-01-03 NOTE — Assessment & Plan Note (Signed)
No further weight loss.  Patient states that she feels well and states patient weight is at her baseline.

## 2020-01-03 NOTE — Telephone Encounter (Signed)
Would you make sure cpe is scheduled?

## 2020-01-07 NOTE — Telephone Encounter (Signed)
Physical is scheduled on 02/04/2020.

## 2020-01-26 ENCOUNTER — Other Ambulatory Visit: Payer: Self-pay | Admitting: Family

## 2020-01-26 DIAGNOSIS — Z803 Family history of malignant neoplasm of breast: Secondary | ICD-10-CM

## 2020-02-04 ENCOUNTER — Encounter: Payer: Self-pay | Admitting: Family

## 2020-02-04 ENCOUNTER — Telehealth: Payer: Self-pay | Admitting: Family

## 2020-02-04 ENCOUNTER — Ambulatory Visit (INDEPENDENT_AMBULATORY_CARE_PROVIDER_SITE_OTHER): Payer: 59 | Admitting: Family

## 2020-02-04 ENCOUNTER — Other Ambulatory Visit (HOSPITAL_COMMUNITY)
Admission: RE | Admit: 2020-02-04 | Discharge: 2020-02-04 | Disposition: A | Discharge status: Home / Self Care | Payer: 59 | Source: Ambulatory Visit | Attending: Family | Admitting: Family

## 2020-02-04 ENCOUNTER — Other Ambulatory Visit: Payer: Self-pay

## 2020-02-04 VITALS — BP 98/64 | HR 99 | Temp 98.1°F | Ht 60.95 in | Wt 96.6 lb

## 2020-02-04 DIAGNOSIS — Z30011 Encounter for initial prescription of contraceptive pills: Secondary | ICD-10-CM

## 2020-02-04 DIAGNOSIS — R41840 Attention and concentration deficit: Secondary | ICD-10-CM

## 2020-02-04 DIAGNOSIS — Z0000 Encounter for general adult medical examination without abnormal findings: Secondary | ICD-10-CM | POA: Insufficient documentation

## 2020-02-04 DIAGNOSIS — F419 Anxiety disorder, unspecified: Secondary | ICD-10-CM | POA: Diagnosis not present

## 2020-02-04 LAB — COMPREHENSIVE METABOLIC PANEL
ALT: 16 U/L (ref 0–35)
AST: 17 U/L (ref 0–37)
Albumin: 4.2 g/dL (ref 3.5–5.2)
Alkaline Phosphatase: 65 U/L (ref 39–117)
BUN: 16 mg/dL (ref 6–23)
CO2: 24 mEq/L (ref 19–32)
Calcium: 8.8 mg/dL (ref 8.4–10.5)
Chloride: 106 mEq/L (ref 96–112)
Creatinine, Ser: 0.66 mg/dL (ref 0.40–1.20)
GFR: 104.78 mL/min (ref >60.00)
Glucose, Bld: 88 mg/dL (ref 70–99)
Potassium: 3.4 mEq/L — ABNORMAL LOW (ref 3.5–5.1)
Sodium: 136 mEq/L (ref 135–145)
Total Bilirubin: 0.6 mg/dL (ref 0.2–1.2)
Total Protein: 7 g/dL (ref 6.0–8.3)

## 2020-02-04 LAB — CBC WITH DIFFERENTIAL/PLATELET
Basophils Absolute: 0.1 10*3/uL (ref 0.0–0.1)
Basophils Relative: 0.9 % (ref 0.0–3.0)
Eosinophils Absolute: 0.1 10*3/uL (ref 0.0–0.7)
Eosinophils Relative: 0.9 % (ref 0.0–5.0)
HCT: 35.6 % — ABNORMAL LOW (ref 36.0–46.0)
Hemoglobin: 11.4 g/dL — ABNORMAL LOW (ref 12.0–15.0)
Lymphocytes Relative: 38.5 % (ref 12.0–46.0)
Lymphs Abs: 3.6 10*3/uL (ref 0.7–4.0)
MCHC: 32.2 g/dL (ref 30.0–36.0)
MCV: 80 fl (ref 78.0–100.0)
Monocytes Absolute: 0.7 10*3/uL (ref 0.1–1.0)
Monocytes Relative: 8 % (ref 3.0–12.0)
Neutro Abs: 4.9 10*3/uL (ref 1.4–7.7)
Neutrophils Relative %: 51.7 % (ref 43.0–77.0)
Platelets: 227 10*3/uL (ref 150.0–400.0)
RBC: 4.44 Mil/uL (ref 3.87–5.11)
RDW: 15.6 % — ABNORMAL HIGH (ref 11.5–15.5)
WBC: 9.4 10*3/uL (ref 4.0–10.5)

## 2020-02-04 LAB — LIPID PANEL
Cholesterol: 148 mg/dL (ref 0–200)
HDL: 79.1 mg/dL (ref >39.00)
LDL Cholesterol: 58 mg/dL (ref 0–99)
NonHDL: 68.7
Total CHOL/HDL Ratio: 2
Triglycerides: 55 mg/dL (ref 0.0–149.0)
VLDL: 11 mg/dL (ref 0.0–40.0)

## 2020-02-04 LAB — VITAMIN D 25 HYDROXY (VIT D DEFICIENCY, FRACTURES): VITD: 21.11 ng/mL — ABNORMAL LOW (ref 30.00–100.00)

## 2020-02-04 LAB — TSH: TSH: 1.98 u[IU]/mL (ref 0.35–4.50)

## 2020-02-04 MED ORDER — AMPHETAMINE-DEXTROAMPHETAMINE 5 MG PO TABS: 5.0000 mg | ORAL_TABLET | Freq: Two times a day (BID) | ORAL | 0 refills | Status: DC

## 2020-02-04 MED ORDER — SERTRALINE HCL 25 MG PO TABS: 25.0000 mg | ORAL_TABLET | Freq: Every day | ORAL | 1 refills | Status: DC

## 2020-02-04 NOTE — Assessment & Plan Note (Addendum)
Uncontrolled, patient would like to start Adderall weekly to help with school.  Will start on adderall low-dose and certainly watch for increased anxiety or weight loss I looked up patient on [place] Controlled Substances Reporting System and saw no activity that raised concern of inappropriate use.  CSC in place.

## 2020-02-04 NOTE — Progress Notes (Signed)
Subjective:    Patient ID: Danielle Casey, female    DOB: 02-Jul-1991, 29 y.o.   MRN: [id number]  CC: Danielle Casey is a 29 y.o. female who presents today for physical exam.    HPI: Feels well.   ADHD- diagnosed when 29 years old. Formally diagnosed at [facility] in [place].  had been on vyvanse,conserta in the past however doesn't feel that effective. Notices during school and watching powerpoint, mind will drift. 'daydreamy'. Would like adderall for school.  Works Saturday- Tuesday, 830pm-630AM, working change to 3rd shift on weekend which will be less days at work, and she feels the anxiety will improve. Weight has been stable, normal is 95-100lbs. She is reminding herself to take breaks, have a snack.   Stress at work. Hot baths, working out help with anxiety. More trouble staying asleep. No depression. No si/hi.  OCP- would like to come off of medication as she worries about developing blood clots as she sits at a desk most of the day.  Lives with boyfriend.      Colorectal Cancer Screening: No early family history.  Breast Cancer Screening:  Due. Didn't have health insurance last year and never had diagnostic breast ultrasound or mammogram. She also meets criteria for annual breast MRI at age 2. She got a call for MRI that she needs mammogram first.   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        Tetanus - utd      Labs: Screening labs today. Exercise: No regular exercise.  Alcohol use: occassional Smoking/tobacco use: former smoker.    HISTORY:  Past Medical History:  Diagnosis Date  . Migraines     Past Surgical History:  Procedure Laterality Date  . NO PAST SURGERIES     Family History  Problem Relation Age of Onset  . Breast cancer Mother 34  . Mental illness Mother   . Cervical cancer Mother   . Breast cancer Maternal Grandmother 40  . Arthritis Maternal Grandmother   . Hypertension Maternal Grandmother   . Diabetes Maternal  Grandmother   . Mental illness Maternal Grandmother   . Colon cancer Maternal Uncle        unsure of age  . Thyroid cancer Maternal Uncle 45  . Cervical cancer Sister 76  . Clotting disorder Neg Hx       ALLERGIES: Patient has no known allergies.  Current Outpatient Medications on File Prior to Visit  Medication Sig Dispense Refill  . norgestimate-ethinyl estradiol (SPRINTEC 28) 0.25-35 MG-MCG tablet Take 1 tablet by mouth daily. 3 Package 4   No current facility-administered medications on file prior to visit.    Social History   Tobacco Use  . Smoking status: Former Smoker    Types: E-cigarettes  . Smokeless tobacco: Current User  . Tobacco comment: Juul regularly  Substance Use Topics  . Alcohol use: Yes    Alcohol/week: 1.0 - 2.0 standard drinks    Types: 1 - 2 Standard drinks or equivalent per week    Comment: occasional, once per month  . Drug use: No    Review of Systems  Constitutional: Negative for chills, fever and unexpected weight change.  HENT: Negative for congestion.   Respiratory: Negative for cough.   Cardiovascular: Negative for chest pain, palpitations and leg swelling.  Gastrointestinal: Negative for nausea and vomiting.  Musculoskeletal: Negative for arthralgias and myalgias.  Skin: Negative for rash.  Neurological: Negative for headaches.  Hematological: Negative for adenopathy.  Psychiatric/Behavioral: Positive for sleep disturbance. Negative for confusion. The patient is nervous/anxious.       Objective:    BP 98/64   Pulse 99   Temp 98.1 F (36.7 C)   Ht 5' 0.95" (1.548 m)   Wt 96 lb 9.6 oz (43.8 kg)   SpO2 99%   BMI 18.28 kg/m   BP Readings from Last 3 Encounters:  02/04/20 98/64  10/24/18 102/70  09/27/18 100/62   Wt Readings from Last 3 Encounters:  02/04/20 96 lb 9.6 oz (43.8 kg)  01/03/20 100 lb (45.4 kg)  10/24/18 87 lb 6.4 oz (39.6 kg)    Physical Exam Vitals reviewed.  Constitutional:      Appearance: She is  well-developed.  Eyes:     Conjunctiva/sclera: Conjunctivae normal.  Neck:     Thyroid: No thyroid mass or thyromegaly.  Cardiovascular:     Rate and Rhythm: Normal rate and regular rhythm.     Pulses: Normal pulses.     Heart sounds: Normal heart sounds.  Pulmonary:     Effort: Pulmonary effort is normal.     Breath sounds: Normal breath sounds. No wheezing, rhonchi or rales.  Chest:     Breasts: Breasts are symmetrical.        Right: No inverted nipple, mass, nipple discharge, skin change or tenderness.        Left: No inverted nipple, mass, nipple discharge, skin change or tenderness.  Genitourinary:    Cervix: No cervical motion tenderness, discharge or friability.     Uterus: Not enlarged, not fixed and not tender.      Adnexa:        Right: No mass, tenderness or fullness.         Left: No mass, tenderness or fullness.       Comments: Pap performed. No CMT. Unable to appreciated ovaries. Lymphadenopathy:     Head:     Right side of head: No submental, submandibular, tonsillar, preauricular, posterior auricular or occipital adenopathy.     Left side of head: No submental, submandibular, tonsillar, preauricular, posterior auricular or occipital adenopathy.     Cervical:     Right cervical: No superficial, deep or posterior cervical adenopathy.    Left cervical: No superficial, deep or posterior cervical adenopathy.     Upper Body:     Right upper body: No pectoral adenopathy.     Left upper body: No pectoral adenopathy.  Skin:    General: Skin is warm and dry.  Neurological:     Mental Status: She is alert.  Psychiatric:        Speech: Speech normal.        Behavior: Behavior normal.        Thought Content: Thought content normal.        Assessment & Plan:   Problem List Items Addressed This Visit      Other   Anxiety    Unchanged.  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  Close follow-up      Relevant Medications   sertraline (ZOLOFT) 25 MG tablet   Concentration deficit    Uncontrolled, patient would like to start Adderall weekly to help with school.  Will start on adderall low-dose and certainly watch for increased anxiety or weight loss I looked up patient on [place] Controlled Substances Reporting System and saw no activity that raised concern  of inappropriate use.  CSC in place.        Relevant Medications   amphetamine-dextroamphetamine (ADDERALL) 5 MG tablet   Contraception management   Relevant Orders   Ambulatory referral to Obstetrics / Gynecology   Routine physical examination - Primary    Clinical breast exam and Pap smear performed today.  Discussed patient's overall lifetime risk of developing breast cancer greater than 20%.  She will need annual mammogram AND breast MRI. Patient will call for scheduling these.  Patient will schedule mammogram right away.  I will have my nurse call [facility] to investigate why MRI has not been scheduled.      Relevant Medications   amphetamine-dextroamphetamine (ADDERALL) 5 MG tablet   Other Relevant Orders   TSH   CBC with Differential/Platelet   Comprehensive metabolic panel   Lipid panel   VITAMIN D 25 Hydroxy (Vit-D Deficiency, Fractures)   MM 3D SCREEN BREAST BILATERAL   Cytology - PAP       I am having Danielle Casey maintain her norgestimate-ethinyl estradiol, sertraline, and amphetamine-dextroamphetamine.   Meds ordered this encounter  Medications  . DISCONTD: amphetamine-dextroamphetamine (ADDERALL) 5 MG tablet    Sig: Take 1 tablet (5 mg total) by mouth 2 (two) times daily.    Dispense:  60 tablet    Refill:  0    Order Specific Question:   Supervising Provider    Answer:   Duncan Dull L [[id number]]  . DISCONTD: sertraline (ZOLOFT) 25 MG tablet    Sig: Take 1 tablet (25 mg total) by mouth daily.    Dispense:  90 tablet    Refill:  1    Order Specific Question:   Supervising  Provider    Answer:   Duncan Dull L [[id number]]  . sertraline (ZOLOFT) 25 MG tablet    Sig: Take 1 tablet (25 mg total) by mouth daily.    Dispense:  90 tablet    Refill:  1    Order Specific Question:   Supervising Provider    Answer:   Duncan Dull L [[id number]]  . amphetamine-dextroamphetamine (ADDERALL) 5 MG tablet    Sig: Take 1 tablet (5 mg total) by mouth 2 (two) times daily.    Dispense:  60 tablet    Refill:  0    Order Specific Question:   Supervising Provider    Answer:   Sherlene Shams [[id number]]    Return precautions given.   Risks, benefits, and alternatives of the medications and treatment plan prescribed today were discussed, and patient expressed understanding.   Education regarding symptom management and diagnosis given to patient on AVS.   Continue to follow with [name], FNP for routine health maintenance.   Danielle Casey and I agreed with plan.   [name], FNP

## 2020-02-04 NOTE — Assessment & Plan Note (Signed)
Clinical breast exam and Pap smear performed today.  Discussed patient's overall lifetime risk of developing breast cancer greater than 20%.  She will need annual mammogram AND breast MRI. Patient will call for scheduling these.  Patient will schedule mammogram right away.  I will have my nurse call [facility] to investigate why MRI has not been scheduled.

## 2020-02-04 NOTE — Assessment & Plan Note (Signed)
Unchanged.  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  Close follow-up

## 2020-02-04 NOTE — Telephone Encounter (Signed)
Call [facility] and cancel adderall and zoloft rx. Sent in error

## 2020-02-04 NOTE — Patient Instructions (Signed)
Please call call and schedule your 3D mammogram  discussed.   [place], [place]  Start zoloft at bedtime  Start adderall 5mg  twice per day.    Health Maintenance, Female Adopting a healthy lifestyle and getting preventive care are important in promoting health and wellness. Ask your health care provider about:  The right schedule for you to have regular tests and exams.  Things you can do on your own to prevent diseases and keep yourself healthy. What should I know about diet, weight, and exercise? Eat a healthy diet   Eat a diet that includes plenty of vegetables, fruits, low-fat dairy products, and lean protein.  Do not eat a lot of foods that are high in solid fats, added sugars, or sodium. Maintain a healthy weight Body mass index (BMI) is used to identify weight problems. It estimates body fat based on height and weight. Your health care provider can help determine your BMI and help you achieve or maintain a healthy weight. Get regular exercise Get regular exercise. This is one of the most important things you can do for your health. Most adults should:  Exercise for at least 150 minutes each week. The exercise should increase your heart rate and make you sweat (moderate-intensity exercise).  Do strengthening exercises at least twice a week. This is in addition to the moderate-intensity exercise.  Spend less time sitting. Even light physical activity can be beneficial. Watch cholesterol and blood lipids Have your blood tested for lipids and cholesterol at 31 years of age, then have this test every 5 years. Have your cholesterol levels checked more often if:  Your lipid or cholesterol levels are high.  You are older than 31 years of age.  You are at high risk for heart disease. What should I know about cancer screening? Depending on your health history and family history, you may need to have cancer  screening at various ages. This may include screening for:  Breast cancer.  Cervical cancer.  Colorectal cancer.  Skin cancer.  Lung cancer. What should I know about heart disease, diabetes, and high blood pressure? Blood pressure and heart disease  High blood pressure causes heart disease and increases the risk of stroke. This is more likely to develop in people who have high blood pressure readings, are of African descent, or are overweight.  Have your blood pressure checked: ? Every 3-5 years if you are 16-31 years of age. ? Every year if you are 43 years old or older. Diabetes Have regular diabetes screenings. This checks your fasting blood sugar level. Have the screening done:  Once every three years after age 29 if you are at a normal weight and have a low risk for diabetes.  More often and at a younger age if you are overweight or have a high risk for diabetes. What should I know about preventing infection? Hepatitis B If you have a higher risk for hepatitis B, you should be screened for this virus. Talk with your health care provider to find out if you are at risk for hepatitis B infection. Hepatitis C Testing is recommended for:  Everyone born from [birth year] through 1965.  Anyone with known risk factors for hepatitis C. Sexually transmitted infections (STIs)  Get screened for STIs, including gonorrhea and chlamydia, if: ? You are sexually active and are younger than 31 years of age. ? You are older than 31 years of age and your health  care provider tells you that you are at risk for this type of infection. ? Your sexual activity has changed since you were last screened, and you are at increased risk for chlamydia or gonorrhea. Ask your health care provider if you are at risk.  Ask your health care provider about whether you are at high risk for HIV. Your health care provider may recommend a prescription medicine to help prevent HIV infection. If you choose to take  medicine to prevent HIV, you should first get tested for HIV. You should then be tested every 3 months for as long as you are taking the medicine. Pregnancy  If you are about to stop having your period (premenopausal) and you may become pregnant, seek counseling before you get pregnant.  Take 400 to 800 micrograms (mcg) of folic acid every day if you become pregnant.  Ask for birth control (contraception) if you want to prevent pregnancy. Osteoporosis and menopause Osteoporosis is a disease in which the bones lose minerals and strength with aging. This can result in bone fractures. If you are 27 years old or older, or if you are at risk for osteoporosis and fractures, ask your health care provider if you should:  Be screened for bone loss.  Take a calcium or vitamin D supplement to lower your risk of fractures.  Be given hormone replacement therapy (HRT) to treat symptoms of menopause. Follow these instructions at home: Lifestyle  Do not use any products that contain nicotine or tobacco, such as cigarettes, e-cigarettes, and chewing tobacco. If you need help quitting, ask your health care provider.  Do not use street drugs.  Do not share needles.  Ask your health care provider for help if you need support or information about quitting drugs. Alcohol use  Do not drink alcohol if: ? Your health care provider tells you not to drink. ? You are pregnant, may be pregnant, or are planning to become pregnant.  If you drink alcohol: ? Limit how much you use to 0-1 drink a day. ? Limit intake if you are breastfeeding.  Be aware of how much alcohol is in your drink. In the [place] one drink equals one 12 oz bottle of beer (355 mL), one 5 oz glass of wine (148 mL), or one 1 oz glass of hard liquor (44 mL). General instructions  Schedule regular health, dental, and eye exams.  Stay current with your vaccines.  Tell your health care provider if: ? You often feel depressed. ? You have  ever been abused or do not feel safe at home. Summary  Adopting a healthy lifestyle and getting preventive care are important in promoting health and wellness.  Follow your health care provider's instructions about healthy diet, exercising, and getting tested or screened for diseases.  Follow your health care provider's instructions on monitoring your cholesterol and blood pressure. This information is not intended to replace advice given to you by your health care provider. Make sure you discuss any questions you have with your health care provider. Document Revised: 11/07/2018 Document Reviewed: 11/07/2018 Elsevier Patient Education  2020 [employer].

## 2020-02-04 NOTE — Progress Notes (Signed)
I spoke with [facility] & they said there was issue getting her scheduled ina couple of months. She did need to have her screening mammogram done first though. This is scheduled for 3/15. Then she could call then back in a couple months to get MRI scheduled.   LM for patient letting her know this. I provided name of scheduler, Lelon Mast as well as number to [facility].

## 2020-02-04 NOTE — Telephone Encounter (Signed)
I have called [facility] & canceled.

## 2020-02-05 ENCOUNTER — Other Ambulatory Visit: Payer: Self-pay | Admitting: Family

## 2020-02-05 DIAGNOSIS — R899 Unspecified abnormal finding in specimens from other organs, systems and tissues: Secondary | ICD-10-CM

## 2020-02-05 LAB — CYTOLOGY - PAP
Comment: NEGATIVE
Diagnosis: NEGATIVE
High risk HPV: NEGATIVE

## 2020-02-10 ENCOUNTER — Telehealth: Payer: Self-pay | Admitting: Family

## 2020-02-10 NOTE — Telephone Encounter (Signed)
I called patient to advise on below. She will stop adderall & give Zoloft some time to work. She will call though if HA persists or if any new symptoms occur.

## 2020-02-10 NOTE — Telephone Encounter (Signed)
Pt states that the two meds that were prescribed recently are giving her a persistent headache. sertraline (ZOLOFT) 25 MG tablet and amphetamine-dextroamphetamine (ADDERALL) 5 MG tablet. Please call back to advise and leave voicemail on secured vm if she does not answer.

## 2020-02-10 NOTE — Telephone Encounter (Signed)
Should I advise patient on how to wean off or should I try to schedule her follow-up to discuss alternatives?

## 2020-02-10 NOTE — Telephone Encounter (Signed)
Call pt Ask her stop the adderall.  We need to stagger starting these medications as it is difficult to determine side effect with surgery medications at once.  Please give the Zoloft some time to help with anxiety and sleep. We can start adderall at a later date Asked her to call us back if headaches not resolved or she develops new symtoms  .  Please ensure she is a follow-up schedule with me as well

## 2020-02-26 ENCOUNTER — Ambulatory Visit
Admission: RE | Admit: 2020-02-26 | Discharge: 2020-02-26 | Disposition: A | Discharge status: Home / Self Care | Payer: 59 | Source: Ambulatory Visit | Attending: Family | Admitting: Family

## 2020-02-26 DIAGNOSIS — Z803 Family history of malignant neoplasm of breast: Secondary | ICD-10-CM | POA: Insufficient documentation

## 2020-02-26 DIAGNOSIS — Z0000 Encounter for general adult medical examination without abnormal findings: Secondary | ICD-10-CM

## 2020-02-26 DIAGNOSIS — Z1231 Encounter for screening mammogram for malignant neoplasm of breast: Secondary | ICD-10-CM | POA: Insufficient documentation

## 2020-03-09 ENCOUNTER — Other Ambulatory Visit: Payer: Self-pay | Admitting: Family

## 2020-03-09 DIAGNOSIS — Z803 Family history of malignant neoplasm of breast: Secondary | ICD-10-CM

## 2020-03-13 ENCOUNTER — Other Ambulatory Visit: Payer: Self-pay | Admitting: Family

## 2020-03-13 ENCOUNTER — Encounter: Payer: Self-pay | Admitting: Family

## 2020-03-13 DIAGNOSIS — Z803 Family history of malignant neoplasm of breast: Secondary | ICD-10-CM

## 2020-03-19 ENCOUNTER — Other Ambulatory Visit: Payer: Self-pay

## 2020-03-19 ENCOUNTER — Encounter: Payer: Self-pay | Admitting: Certified Nurse Midwife

## 2020-03-19 ENCOUNTER — Telehealth: Payer: Self-pay | Admitting: Family

## 2020-03-19 DIAGNOSIS — R41840 Attention and concentration deficit: Secondary | ICD-10-CM

## 2020-03-19 DIAGNOSIS — Z0000 Encounter for general adult medical examination without abnormal findings: Secondary | ICD-10-CM

## 2020-03-19 DIAGNOSIS — F419 Anxiety disorder, unspecified: Secondary | ICD-10-CM

## 2020-03-19 MED ORDER — SERTRALINE HCL 25 MG PO TABS: 25.0000 mg | ORAL_TABLET | Freq: Every day | ORAL | 1 refills | Status: DC

## 2020-03-19 NOTE — Telephone Encounter (Signed)
Pt states that she is taking both amphetamine-dextroamphetamine (ADDERALL) 5 MG tablet and sertraline (ZOLOFT) 25 MG tablet now. Pt states that she has been taking them both for about a month now and is doing well on them. She has an appt for a follow up scheduled on 05/12. She would like a refill on both of these medications.

## 2020-03-19 NOTE — Telephone Encounter (Signed)
I called patient to make sure that she had enough adderall to last until you returned on Monday & she stated that wa fine. I have refilled zoloft.  Last refilled 02/04/20 Last OV 02/04/20

## 2020-03-20 ENCOUNTER — Telehealth: Payer: Self-pay | Admitting: Family

## 2020-03-20 NOTE — Telephone Encounter (Signed)
Have called patient on 3 different dates and time, could not leave message because voicemail was full each time. MR  From Hematology oncology in [place]

## 2020-03-23 MED ORDER — AMPHETAMINE-DEXTROAMPHETAMINE 5 MG PO TABS: 5.0000 mg | ORAL_TABLET | Freq: Two times a day (BID) | ORAL | 0 refills | Status: DC

## 2020-03-23 NOTE — Telephone Encounter (Signed)
Call pt adderall refilled for 3 months with sep prescriptons  Also, I need formal diagnosis paperwork scanned in chart from [facility] in [place] where she was diagnosed.   I dont see it- do you?  Please ask pt to send.  Also, Advise requests for controlled substance through pharmacy or mychart refill request ONE week in advance so no delays in prescription. If patient call office  for refill there can be delays due to call volume and doesn't go to refill inbox  I looked up patient on [place] Controlled Substances Reporting System PMP AWARE and saw no activity that raised concern of inappropriate use.

## 2020-03-23 NOTE — Addendum Note (Signed)
Addended by: [name] on: 03/23/2020 01:34 PM   Modules accepted: Orders

## 2020-03-23 NOTE — Telephone Encounter (Signed)
I called patient but was unable to LM due to VM being full.

## 2020-03-24 NOTE — Telephone Encounter (Signed)
Patient called,vb is cleared. Patient works [employer]. Good time to call her is 8am. Patient was asked to call referrals  Tomorrow.

## 2020-03-25 NOTE — Telephone Encounter (Signed)
Ok. Thanks!

## 2020-03-27 NOTE — Telephone Encounter (Signed)
I see appointment with oncology 03/30/2020.  I presume patient is aware?

## 2020-03-28 NOTE — Progress Notes (Signed)
Danielle Casey  Telephone:([phone]) [phone] Fax:(336) [phone]  ID: [name] OB: 19-Feb-1991  MR#: [id number]  CBJ#:[id number]  Patient Care Team: Burnard Hawthorne, FNP as PCP - General (Family Medicine)  CHIEF COMPLAINT: Family history of breast cancer.  INTERVAL HISTORY: Patient is a 29 year old female who was referred to clinic for high risk breast cancer as well as a significant family history of breast cancer.  She currently feels well and is asymptomatic.  She has no neurologic complaints.  She denies any recent fevers or illnesses.  She has a good appetite and denies weight loss.  She has no chest pain, shortness of breath, cough, or hemoptysis.  She denies any nausea, vomiting, constipation, or diarrhea.  She has no urinary complaints.  Patient feels at her baseline and offers no specific complaints today.  REVIEW OF SYSTEMS:   Review of Systems  Constitutional: Negative.  Negative for fever, malaise/fatigue and weight loss.  Respiratory: Negative.  Negative for cough, hemoptysis and shortness of breath.   Cardiovascular: Negative.  Negative for chest pain and leg swelling.  Gastrointestinal: Negative.  Negative for abdominal pain.  Genitourinary: Negative.  Negative for dysuria.  Musculoskeletal: Negative.  Negative for back pain.  Skin: Negative.  Negative for rash.  Neurological: Negative.  Negative for dizziness, focal weakness, weakness and headaches.  Psychiatric/Behavioral: Negative.  The patient is not nervous/anxious.     As per HPI. Otherwise, a complete review of systems is negative.  PAST MEDICAL HISTORY: Past Medical History:  Diagnosis Date  . Migraines     PAST SURGICAL HISTORY: Past Surgical History:  Procedure Laterality Date  . NO PAST SURGERIES      FAMILY HISTORY: Family History  Problem Relation Age of Onset  . Breast cancer Mother 60  . Mental illness Mother   . Cervical cancer Mother   . Breast cancer Maternal  Grandmother 60  . Arthritis Maternal Grandmother   . Hypertension Maternal Grandmother   . Diabetes Maternal Grandmother   . Mental illness Maternal Grandmother   . Colon cancer Maternal Uncle        unsure of age  . Thyroid cancer Maternal Uncle 18  . Cervical cancer Sister 35  . Clotting disorder Neg Hx     ADVANCED DIRECTIVES (Y/N):  N  HEALTH MAINTENANCE: Social History   Tobacco Use  . Smoking status: Former Smoker    Types: E-cigarettes  . Smokeless tobacco: Current User  . Tobacco comment: Juul regularly  Substance Use Topics  . Alcohol use: Yes    Alcohol/week: 1.0 - 2.0 standard drinks    Types: 1 - 2 Standard drinks or equivalent per week    Comment: occasional, once per month  . Drug use: No     Colonoscopy:  PAP:  Bone density:  Lipid panel:  No Known Allergies  Current Outpatient Medications  Medication Sig Dispense Refill  . amphetamine-dextroamphetamine (ADDERALL) 5 MG tablet Take 1 tablet (5 mg total) by mouth 2 (two) times daily. 60 tablet 0  . norgestimate-ethinyl estradiol (SPRINTEC 28) 0.25-35 MG-MCG tablet Take 1 tablet by mouth daily. 3 Package 4  . sertraline (ZOLOFT) 25 MG tablet Take 1 tablet (25 mg total) by mouth daily. 90 tablet 1  . VITAMIN D, CHOLECALCIFEROL, PO Take 1 tablet by mouth every other day.     No current facility-administered medications for this visit.    OBJECTIVE: Vitals:   03/30/20 1508  BP: 114/85  Pulse: 63  Resp: 18  Temp: Marland Kitchen)  96.9 F (36.1 C)  SpO2: 100%     Body mass index is 17.9 kg/m.    ECOG FS:0 - Asymptomatic  General: Well-developed, well-nourished, no acute distress. Eyes: Pink conjunctiva, anicteric sclera. HEENT: Normocephalic, moist mucous membranes. Lungs: No audible wheezing or coughing. Heart: Regular rate and rhythm. Abdomen: Soft, nontender, no obvious distention. Musculoskeletal: No edema, cyanosis, or clubbing. Neuro: Alert, answering all questions appropriately. Cranial nerves  grossly intact. Skin: No rashes or petechiae noted. Psych: Normal affect. Lymphatics: No cervical, calvicular, axillary or inguinal LAD.   LAB RESULTS:  Lab Results  Component Value Date   NA 136 02/04/2020   K 3.4 (L) 02/04/2020   CL 106 02/04/2020   CO2 24 02/04/2020   GLUCOSE 88 02/04/2020   BUN 16 02/04/2020   CREATININE 0.66 02/04/2020   CALCIUM 8.8 02/04/2020   PROT 7.0 02/04/2020   ALBUMIN 4.2 02/04/2020   AST 17 02/04/2020   ALT 16 02/04/2020   ALKPHOS 65 02/04/2020   BILITOT 0.6 02/04/2020   GFRNONAA 121 01/03/2017   GFRAA 140 01/03/2017    Lab Results  Component Value Date   WBC 9.4 02/04/2020   NEUTROABS 4.9 02/04/2020   HGB 11.4 (L) 02/04/2020   HCT 35.6 (L) 02/04/2020   MCV 80.0 02/04/2020   PLT 227.0 02/04/2020     STUDIES: No results found.  ASSESSMENT: Family history of breast cancer.  PLAN:    1. Family history of breast cancer: Patient reports her maternal grandmother had breast cancer at age of 37, and currently is still living at age 68.  She also reports her mother had breast cancer at the age of 49, but is still alive in her early 6s.  She also has multiple maternal relatives with cervical cancer.  Based on her family history, she would definitely benefit from genetic testing and this was ordered today.  If negative, no further interventions are needed.  If positive, will refer patient to genetic counseling for further discussion of her results. 2.  High risk breast cancer: Per Tyer-[place] model patient has a greater than 38% chance lifetime risk of developing breast cancer.  She has been instructed at minimum to continue screening mammograms on a yearly basis.  Will await genetic testing to determine the benefit of alternating mammogram with breast MRI.  Discussed at length prophylactic tamoxifen for 5 years, but patient wishes to think further whether or not she wishes to pursue this option. 3.  Birth control: Patient states she is on  estrogen-based birth control.  Recommend she discuss with her primary care physician possibly switching to a nonestrogen-based treatment. 4.  Disposition: Patient will have video assisted telemedicine visit in 4 weeks to discuss her genetic results and prophylactic treatment if desired.  I spent a total of 60 minutes reviewing chart data, face-to-face evaluation with the patient, counseling and coordination of care as detailed above.   Patient expressed understanding and was in agreement with this plan. She also understands that She can call clinic at any time with any questions, concerns, or complaints.    Jeralyn Ruths, MD   03/30/2020 4:10 PM

## 2020-03-30 ENCOUNTER — Encounter: Payer: Self-pay | Admitting: Oncology

## 2020-03-30 ENCOUNTER — Inpatient Hospital Stay: Payer: 59 | Attending: Oncology | Admitting: Oncology

## 2020-03-30 ENCOUNTER — Inpatient Hospital Stay: Payer: 59

## 2020-03-30 ENCOUNTER — Other Ambulatory Visit: Payer: Self-pay

## 2020-03-30 VITALS — BP 114/85 | HR 63 | Temp 96.9°F | Resp 18 | Wt 94.6 lb

## 2020-03-30 DIAGNOSIS — R922 Inconclusive mammogram: Secondary | ICD-10-CM | POA: Insufficient documentation

## 2020-03-30 DIAGNOSIS — Z803 Family history of malignant neoplasm of breast: Secondary | ICD-10-CM | POA: Diagnosis present

## 2020-03-30 NOTE — Progress Notes (Signed)
New patient here today for intial visit. Denies pains or other concerns at this time.

## 2020-04-01 ENCOUNTER — Telehealth: Payer: Self-pay | Admitting: Family

## 2020-04-01 NOTE — Telephone Encounter (Signed)
Call pt Reviewed Dr Milinda Cave note Make appt to discuss NON estrogren birth control due to breast cancer risk

## 2020-04-01 NOTE — Telephone Encounter (Signed)
Mychart message sent to patient.

## 2020-04-01 NOTE — Telephone Encounter (Signed)
LMTCB

## 2020-04-02 NOTE — Telephone Encounter (Signed)
I spoke with patient about formal testing for the adderall refills. She had called [facility] & was directed to someone to leave a message. She Was told that since the testing was 20 years ago that they may not have records. Patient is willing to be referred & be retested.

## 2020-04-03 NOTE — Addendum Note (Signed)
Addended by: [name] on: 04/03/2020 04:01 PM   Modules accepted: Orders

## 2020-04-03 NOTE — Telephone Encounter (Signed)
Call pt Referral placed for ADHD evaluation Let us know if you dont hear back within a week in regards to an appointment being scheduled.

## 2020-04-03 NOTE — Telephone Encounter (Signed)
LM that referral was placed & should get a call within a week to set up. I asked pt to call back if any questions.

## 2020-04-08 ENCOUNTER — Ambulatory Visit: Payer: 59 | Admitting: Family

## 2020-04-22 ENCOUNTER — Ambulatory Visit (INDEPENDENT_AMBULATORY_CARE_PROVIDER_SITE_OTHER): Payer: 59 | Admitting: Family

## 2020-04-22 ENCOUNTER — Other Ambulatory Visit: Payer: Self-pay

## 2020-04-22 ENCOUNTER — Encounter: Payer: Self-pay | Admitting: Family

## 2020-04-22 VITALS — BP 98/66 | HR 82 | Temp 98.5°F | Ht 60.95 in | Wt 95.4 lb

## 2020-04-22 DIAGNOSIS — R0789 Other chest pain: Secondary | ICD-10-CM | POA: Diagnosis not present

## 2020-04-22 DIAGNOSIS — F419 Anxiety disorder, unspecified: Secondary | ICD-10-CM | POA: Diagnosis not present

## 2020-04-22 DIAGNOSIS — R41840 Attention and concentration deficit: Secondary | ICD-10-CM | POA: Diagnosis not present

## 2020-04-22 MED ORDER — OMEPRAZOLE 20 MG PO CPDR: 20.0000 mg | DELAYED_RELEASE_CAPSULE | Freq: Every day | ORAL | 3 refills | Status: DC

## 2020-04-22 NOTE — Patient Instructions (Addendum)
Trial of prilosec  Please use consistently for the next 2 to3  weeks,   Please stay incredibly vigilant in regards to the chest tightness.  Certainly if you develop any, crushing chest pain, left arm pain or numbness or pain radiating to the jaw, shortness of breath or palpitations, I would like to get emergency room

## 2020-04-22 NOTE — Progress Notes (Signed)
Subjective:    Patient ID: Danielle Casey, female    DOB: 1991/03/25, 29 y.o.   MRN: [id number]  CC: [name] is a 29 y.o. female who presents today for follow up.   HPI:  ADHD-feels well on Adderall 5 mg. She does state that she stopped coffee but she will have 1 cup to drink when she takes her Adderall in the morning. Typically she does not feel that she needs to use a second milligram dose in the afternoon. Her weight has been stable. She denies any palpitations, increased anxiety or trouble sleeping on this medication.  In discussing side effects of Adderall, she states that for approximately 10 years she has had a central midsternal "tightness". She made her prior PCP aware of this. She states that it comes and goes. She was seen in emergency room 10 years ago, 2011 ( unable to see in Epic due to conversion) and was told it was costochondritis. She states that the pain was random when it occurs.  Symptom may occur when she is still or with activity. She denies any nausea, vomiting, left arm numbness or pain, cough, epigastric burning, regurgitation vision changes or headache. She states at times the pain can feel sharp and rates it as a 3 out of 10. She is a former smoker and quit 2 years ago. No history of asthma. On occasion she does take Tums due to dry heaving, nausea after a meal. Otherwise, no known history of acid reflux No family history of sudden cardiac death, arrhythmias or heart attack  GAD-feels like 'the edge' has  been taken off and she is on a low dose of Zoloft. She does not know if she is increased medication    HISTORY:  Past Medical History:  Diagnosis Date  . Migraines    Past Surgical History:  Procedure Laterality Date  . NO PAST SURGERIES     Family History  Problem Relation Age of Onset  . Breast cancer Mother 78  . Mental illness Mother   . Cervical cancer Mother   . Breast cancer Maternal Grandmother 66  . Arthritis Maternal Grandmother     . Hypertension Maternal Grandmother   . Diabetes Maternal Grandmother   . Mental illness Maternal Grandmother   . Colon cancer Maternal Uncle        unsure of age  . Thyroid cancer Maternal Uncle 67  . Cervical cancer Sister 93  . Clotting disorder Neg Hx   . Heart attack Neg Hx   . Arrhythmia Neg Hx   . Sudden Cardiac Death Neg Hx     Allergies: Patient has no known allergies. Current Outpatient Medications on File Prior to Visit  Medication Sig Dispense Refill  . amphetamine-dextroamphetamine (ADDERALL) 5 MG tablet Take 1 tablet (5 mg total) by mouth 2 (two) times daily. 60 tablet 0  . sertraline (ZOLOFT) 25 MG tablet Take 1 tablet (25 mg total) by mouth daily. 90 tablet 1  . VITAMIN D, CHOLECALCIFEROL, PO Take 1 tablet by mouth every other day.     No current facility-administered medications on file prior to visit.    Social History   Tobacco Use  . Smoking status: Former Smoker    Types: E-cigarettes  . Smokeless tobacco: Current User  . Tobacco comment: Juul regularly  Substance Use Topics  . Alcohol use: Yes    Alcohol/week: 1.0 - 2.0 standard drinks    Types: 1 - 2 Standard drinks or equivalent per week  Comment: occasional, once per month  . Drug use: No    Review of Systems  Constitutional: Negative for chills, fatigue, fever and unexpected weight change.  Respiratory: Negative for cough and shortness of breath.   Cardiovascular: Positive for chest pain. Negative for palpitations and leg swelling.  Gastrointestinal: Negative for nausea and vomiting.  Psychiatric/Behavioral: Negative for sleep disturbance and suicidal ideas. The patient is not nervous/anxious.       Objective:    BP 98/66   Pulse 82   Temp 98.5 F (36.9 C) (Temporal)   Ht 5' 0.95" (1.548 m)   Wt 95 lb 6.4 oz (43.3 kg)   SpO2 98%   BMI 18.06 kg/m  BP Readings from Last 3 Encounters:  04/22/20 98/66  03/30/20 114/85  02/04/20 98/64   Wt Readings from Last 3 Encounters:   04/22/20 95 lb 6.4 oz (43.3 kg)  03/30/20 94 lb 9.6 oz (42.9 kg)  02/04/20 96 lb 9.6 oz (43.8 kg)    Physical Exam Vitals reviewed.  Constitutional:      Appearance: She is well-developed.  Eyes:     Conjunctiva/sclera: Conjunctivae normal.  Cardiovascular:     Rate and Rhythm: Normal rate and regular rhythm.     Pulses: Normal pulses.     Heart sounds: Normal heart sounds.  Pulmonary:     Effort: Pulmonary effort is normal.     Breath sounds: Normal breath sounds. No wheezing, rhonchi or rales.  Chest:     Chest wall: No tenderness, crepitus or edema. There is no dullness to percussion.  Skin:    General: Skin is warm and dry.  Neurological:     Mental Status: She is alert.  Psychiatric:        Speech: Speech normal.        Behavior: Behavior normal.        Thought Content: Thought content normal.        Assessment & Plan:   Problem List Items Addressed This Visit      Other   Anxiety    Improved, continue Zoloft      Chest tightness - Primary    Reassured by the chronicity of this symptom. She has no chest pain or tightness today. EKG shows sinus rhythm. No significant changes since prior EKG in 2011. Reviewed EKG with Dr Darrick Huntsman.  Discussed with patient the differentials continue to include musculoskeletal such as costochondritis, atypical GERD. Less likely to think cardiac in nature based on absence of risk factors, atypical presentation which occurs at rest and with activity. We jointly agreed based on chronicity of symptoms and since the frequency or severity is unchanged over the past 10 years,it  is reasonable to try Prilosec for the next couple weeks. If no improvement when she returns the office, we will discuss whether echocardiogram is appropriate as well as referral to cardiology. Patient verbalized understanding of plan      Relevant Medications   omeprazole (PRILOSEC) 20 MG capsule   Concentration deficit    Please see that patient feels well on  regimen. Will continue.           I have discontinued Armando Gang. Llerenas's norgestimate-ethinyl estradiol. I am also having her start on omeprazole. Additionally, I am having her maintain her sertraline, amphetamine-dextroamphetamine, and (VITAMIN D, CHOLECALCIFEROL, PO).   Meds ordered this encounter  Medications  . omeprazole (PRILOSEC) 20 MG capsule    Sig: Take 1 capsule (20 mg total) by mouth daily.  Dispense:  30 capsule    Refill:  3    Order Specific Question:   Supervising Provider    Answer:   Sherlene Shams [[id number]]    Return precautions given.   Risks, benefits, and alternatives of the medications and treatment plan prescribed today were discussed, and patient expressed understanding.   Education regarding symptom management and diagnosis given to patient on AVS.  Continue to follow with [name], FNP for routine health maintenance.   Sueanne Margarita and I agreed with plan.   [name], FNP

## 2020-04-22 NOTE — Assessment & Plan Note (Addendum)
Reassured by the chronicity of this symptom. She has no chest pain or tightness today. EKG shows sinus rhythm. No significant changes since prior EKG in 2011. Reviewed EKG with Dr Darrick Huntsman.  Discussed with patient the differentials continue to include musculoskeletal such as costochondritis, atypical GERD. Less likely to think cardiac in nature based on absence of risk factors, atypical presentation which occurs at rest and with activity. We jointly agreed based on chronicity of symptoms and since the frequency or severity is unchanged over the past 10 years,it  is reasonable to try Prilosec for the next couple weeks. If no improvement when she returns the office, we will discuss whether echocardiogram is appropriate as well as referral to cardiology. Patient verbalized understanding of plan

## 2020-04-22 NOTE — Assessment & Plan Note (Signed)
Improved, continue Zoloft

## 2020-04-22 NOTE — Assessment & Plan Note (Signed)
Please see that patient feels well on regimen. Will continue.

## 2020-04-22 NOTE — Progress Notes (Signed)
Not sure if I am missing something, but I only see referral to [facility]. Should she be referred to [facility] or some place like that? Maybe I have misunderstood in the past?

## 2020-04-23 ENCOUNTER — Encounter: Payer: Self-pay | Admitting: Certified Nurse Midwife

## 2020-04-23 ENCOUNTER — Ambulatory Visit (INDEPENDENT_AMBULATORY_CARE_PROVIDER_SITE_OTHER): Payer: 59 | Admitting: Certified Nurse Midwife

## 2020-04-23 VITALS — BP 118/83 | HR 90 | Ht 60.0 in | Wt 94.3 lb

## 2020-04-23 DIAGNOSIS — Z3141 Encounter for fertility testing: Secondary | ICD-10-CM

## 2020-04-23 DIAGNOSIS — Z3009 Encounter for other general counseling and advice on contraception: Secondary | ICD-10-CM

## 2020-04-23 LAB — POCT URINE PREGNANCY: Preg Test, Ur: NEGATIVE

## 2020-04-23 MED ORDER — MISOPROSTOL 200 MCG PO TABS: ORAL_TABLET | ORAL | 0 refills | Status: DC

## 2020-04-23 NOTE — Progress Notes (Addendum)
GYN ENCOUNTER NOTE  Subjective:       Danielle Casey is a 29 y.o. G0P0000 female here for contraception counseling.  Previously used Clinical biochemist; currently using coitus interruptus.   Prefers birth control options with no estrogen due to family history of breast cancer.   Requests labs today to see her hormone levels. Would like to know if she "is fertile".   Denies difficulty breathing or respiratory distress, chest pain, abdominal pain, excessive vaginal bleeding, dysuria, leg pain or swelling   Gynecologic History  Patient's last menstrual period was 04/17/2020 (approximate).   Contraception: coitus interruptus   Last Pap: 02/04/20. Results were: Neg/Neg  Period Cycle (Days): 28 Period Duration (Days): 4 Period Pattern: Regular Menstrual Flow: Moderate Menstrual Control: Tampon Menstrual Control Change Freq (Hours): 2-3 Dysmenorrhea: (!) Mild Dysmenorrhea Symptoms: Cramping  Obstetric History  OB History  Gravida Para Term Preterm AB Living  0 0 0 0 0 0  SAB TAB Ectopic Multiple Live Births  0 0 0 0 0    Past Medical History:  Diagnosis Date  . Migraines     Past Surgical History:  Procedure Laterality Date  . NO PAST SURGERIES      Current Outpatient Medications on File Prior to Visit  Medication Sig Dispense Refill  . amphetamine-dextroamphetamine (ADDERALL) 5 MG tablet Take 1 tablet (5 mg total) by mouth 2 (two) times daily. 60 tablet 0  . omeprazole (PRILOSEC) 20 MG capsule Take 1 capsule (20 mg total) by mouth daily. 30 capsule 3  . sertraline (ZOLOFT) 25 MG tablet Take 1 tablet (25 mg total) by mouth daily. 90 tablet 1  . VITAMIN D, CHOLECALCIFEROL, PO Take 1 tablet by mouth every other day.     No current facility-administered medications on file prior to visit.    No Known Allergies  Social History   Socioeconomic History  . Marital status: Single    Spouse name: None  . Number of children: 0  . Years of education: 2 years of college  .  Highest education level: Associate degree: academic program  Occupational History  . Occupation: Doctor, general practice    Comment: [employer]   Tobacco Use  . Smoking status: Former Smoker    Types: E-cigarettes  . Smokeless tobacco: Current User  . Tobacco comment: Juul regularly  Substance and Sexual Activity  . Alcohol use: Yes    Alcohol/week: 1.0 - 2.0 standard drinks    Types: 1 - 2 Standard drinks or equivalent per week    Comment: occasional, once per month  . Drug use: No  . Sexual activity: Yes    Partners: Male    Birth control/protection: None  Other Topics Concern  . 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 Difficulty of Paying Living Expenses:   Food Insecurity:   . Worried About Charity fundraiser in the Last Year:   . Arboriculturist in the Last Year:   Transportation Needs:   . Film/video editor (Medical):   Marland Kitchen Lack of Transportation (Non-Medical):   Physical Activity:   . Days of Exercise per Week:   . Minutes of Exercise per Session:   Stress:   . Feeling of Stress :   Social Connections:   .  Frequency of Communication with Friends and Family:   . Frequency of Social Gatherings with Friends and Family:   . Attends Religious Services:   . Active Member of Clubs or Organizations:   . Attends Banker Meetings:   Marland Kitchen Marital Status:   Intimate Partner Violence:   . Fear of Current or Ex-Partner:   . Emotionally Abused:   Marland Kitchen Physically Abused:   . Sexually Abused:     Family History  Problem Relation Age of Onset  . Breast cancer Mother 65  . Mental illness Mother   . Cervical cancer Mother   . Breast cancer Maternal Grandmother 40  . Arthritis Maternal Grandmother   . Hypertension Maternal Grandmother   . Diabetes  Maternal Grandmother   . Mental illness Maternal Grandmother   . Colon cancer Maternal Uncle        unsure of age  . Thyroid cancer Maternal Uncle 45  . Cervical cancer Sister 47  . Clotting disorder Neg Hx   . Heart attack Neg Hx   . Arrhythmia Neg Hx   . Sudden Cardiac Death Neg Hx     The following portions of the patient's history were reviewed and updated as appropriate: allergies, current medications, past family history, past medical history, past social history, past surgical history and problem list.  Review of Systems  ROS- negative except as noted above. Information obtained from patient.   Objective:   BP 118/83   Pulse 90   Ht 5' (1.524 m)   Wt 94 lb 5 oz (42.8 kg)   LMP 04/17/2020 (Approximate)   BMI 18.42 kg/m    CONSTITUTIONAL: Well-developed, well-nourished female in no acute distress.   PHYSICAL EXAM: Not indicated at this time.  Recent Results (from the past 2160 hour(s))  POCT urine pregnancy     Status: None   Collection Time: 04/23/20  4:22 PM  Result Value Ref Range   Preg Test, Ur Negative Negative    Assessment:   1. Encounter for counseling regarding contraception  2. Fertility Testing  Plan:   Education given regarding options for contraception, including barrier methods, injectable contraception, IUD placement, oral contraceptives, Patch, Implant and Vaginal Ring.   Prefers Mirena. RTC x 3-4 weeks for placement of IUD. See AVS.  Cytotec Rx'd today: See Orders.  Labs done today. See orders.   Reviewed red flags and when to call the office.   Glorious Peach RN [facility] [employer] 04/23/20 3:49 PM

## 2020-04-23 NOTE — Patient Instructions (Addendum)
Misoprostol tablets What is this medicine? MISOPROSTOL (mye soe PROST ole) helps to prevent stomach ulcers in patients who take medicines like ibuprofen and aspirin and who are at high risk of complications from ulcers. This medicine may be used for other purposes; ask your health care provider or pharmacist if you have questions. COMMON BRAND NAME(S): Cytotec What should I tell my health care provider before I take this medicine? They need to know if you have any of these conditions:  Crohn's disease  heart disease  kidney disease  ulcerative colitis  an unusual or allergic reaction to misoprostol, prostaglandins, other medicines, foods, dyes, or preservatives  pregnant or trying to get pregnant  breast-feeding How should I use this medicine? Take this medicine by mouth with a full glass of water. Follow the directions on the prescription label. Take this medicine with food.Take your medicine at regular intervals. Do not take your medicine more often than directed. Talk to your pediatrician regarding the use of this medicine in children. Special care may be needed. Overdosage: If you think you have taken too much of this medicine contact a poison control center or emergency room at once. NOTE: This medicine is only for you. Do not share this medicine with others. What if I miss a dose? If you miss a dose, take it as soon as you can. If it is almost time for your next dose, take only that dose. Do not take double or extra doses. What may interact with this medicine?  antacids This list may not describe all possible interactions. Give your health care provider a list of all the medicines, herbs, non-prescription drugs, or dietary supplements you use. Also tell them if you smoke, drink alcohol, or use illegal drugs. Some items may interact with your medicine. What should I watch for while using this medicine? Do not smoke cigarettes or drink alcohol. These increase irritation to your  stomach and can make it more susceptible to damage from medicine like ibuprofen and aspirin. If you are female, do not use this medicine if you are pregnant. Do not get pregnant while taking this medicine and for at least one month (one full menstrual cycle) after stopping this medicine. If you can become pregnant, use a reliable form of birth control while taking this medicine. Talk to your doctor about birth control options. If you do become pregnant, think you are pregnant, or want to become pregnant, immediately call your doctor for advice. What side effects may I notice from receiving this medicine? Side effects that you should report to your doctor or health care professional as soon as possible:  allergic reactions like skin rash, itching or hives, swelling of the face, lips, or tongue  chest pain  fainting spells  severe diarrhea  sudden shortness of breath  unusual vaginal bleeding, pelvic pain, or cramping Side effects that usually do not require medical attention (report to your doctor or health care professional if they continue or are bothersome):  dizziness  headache  menstrual irregularity, spotting, or cramps  mild diarrhea  nausea  stomach upset or cramps This list may not describe all possible side effects. Call your doctor for medical advice about side effects. You may report side effects to FDA at 1-800-FDA-1088. Where should I keep my medicine? Keep out of the reach of children. Store at room temperature below 25 degrees C (77 degrees F). Keep in a dry place. Protect from moisture. Throw away any unused medicine after the expiration date. NOTE: This  sheet is a summary. It may not cover all possible information. If you have questions about this medicine, talk to your doctor, pharmacist, or health care provider.  2020 Elsevier/Gold Standard (2008-10-28 10:59:53)   Intrauterine Device Insertion An intrauterine device (IUD) is a medical device that gets  inserted into the uterus to prevent pregnancy. It is a small, T-shaped device that has one or two nylon strings hanging down from it. The strings hang out of the lower part of the uterus (cervix) to allow for future IUD removal. There are two types of IUDs available:  Copper IUD. This type of IUD has copper wire wrapped around it. Copper makes the uterus and fallopian tubes produce a fluid that kills sperm. A copper IUD may last up to 10 years.  Hormone IUD. This type of IUD is made of plastic and contains the hormone progestin (synthetic progesterone). The hormone thickens mucus in the cervix and prevents sperm from entering the uterus. It also thins the uterine lining to prevent implantation of a fertilized egg. The hormone can weaken or kill the sperm that get into the uterus. A hormone IUD may last 3-5 years. Tell a health care provider about:  Any allergies you have.  All medicines you are taking, including vitamins, herbs, eye drops, creams, and over-the-counter medicines.  Any problems you or family members have had with anesthetic medicines.  Any blood disorders you have.  Any surgeries you have had.  Any medical conditions you have, including any STIs (sexually transmitted infections) you may have.  Whether you are pregnant or may be pregnant. What are the risks? Generally, this is a safe procedure. However, problems may occur, including:  Infection.  Bleeding.  Allergic reactions to medicines.  Accidental puncture (perforation) of the uterus, or damage to other structures or organs.  Accidental placement of the IUD either in the muscle layer of the uterus (myometrium) or outside the uterus.  The IUD falling out of the uterus (expulsion). This is more common among women who have recently had a child.  Pregnancy that happens in the fallopian tube (ectopic pregnancy).  Infection of the uterus and fallopian tubes (pelvic inflammatory disease). What happens before the  procedure?  Schedule the IUD insertion for when you will have your menstrual period or right after, to make sure you are not pregnant. Placement of the IUD is better tolerated shortly after a menstrual cycle.  Follow instructions from your health care provider about eating or drinking restrictions.  Ask your health care provider about changing or stopping your regular medicines. This is especially important if you are taking diabetes medicines or blood thinners.  You may get a pain reliever to take before the procedure.  You may have tests for: ? Pregnancy. A pregnancy test involves having a urine sample taken. ? STIs. Placing an IUD in someone who has an STI can make the infection worse. ? Cervical cancer. You may have a Pap test to check for this type of cancer. This means collecting cells from your cervix to be examined under a microscope.  You may have a physical exam to determine the size and position of your uterus. The procedure may vary among health care providers and hospitals. What happens during the procedure?  A tool (speculum) will be placed in your vagina and widened so that your health care provider can see your cervix.  Medicine may be applied to your cervix to help lower your risk of infection (antiseptic medicine).  You may be given  an anesthetic medicine to numb each side of your cervix (intracervical block or paracervical block). This medicine is usually given by an injection into the cervix.  A tool (uterine sound) will be inserted into your uterus to determine the length of your uterus and the direction that your uterus may be tilted.  A slim instrument or tube (IUD inserter) that holds the IUD will be inserted into your vagina, through your cervical canal, and into your uterus.  The IUD will be placed in the uterus, and the IUD inserter will be removed.  The strings that are attached to the IUD will be trimmed so that they lie just below the cervix. The  procedure may vary among health care providers and hospitals. What happens after the procedure?  You may have bleeding after the procedure. This is normal. It varies from light bleeding (spotting) for a few days to menstrual-like bleeding.  You may have cramping and pain.  You may feel dizzy or light-headed.  You may have lower back pain. Summary  An intrauterine device (IUD) is a small, T-shaped device that has one or two nylon strings hanging down from it.  Two types of IUDs are available. You may have a copper IUD or a hormone IUD.  Schedule the IUD insertion for when you will have your menstrual period or right after, to make sure you are not pregnant. Placement of the IUD is better tolerated shortly after a menstrual cycle.  You may have bleeding after the procedure. This is normal. It varies from light spotting for a few days to menstrual-like bleeding. This information is not intended to replace advice given to you by your health care provider. Make sure you discuss any questions you have with your health care provider. Document Revised: 10/27/2017 Document Reviewed: 10/05/2016 Elsevier Patient Education  2020 [employer].   Levonorgestrel intrauterine device (IUD) What is this medicine? LEVONORGESTREL IUD (LEE voe nor jes trel) is a contraceptive (birth control) device. The device is placed inside the uterus by a healthcare professional. It is used to prevent pregnancy. This device can also be used to treat heavy bleeding that occurs during your period. This medicine may be used for other purposes; ask your health care provider or pharmacist if you have questions. COMMON BRAND NAME(S): Cameron Ali What should I tell my health care provider before I take this medicine? They need to know if you have any of these conditions:  abnormal Pap smear  cancer of the breast, uterus, or cervix  diabetes  endometritis  genital or pelvic infection now or in the  past  have more than one sexual partner or your partner has more than one partner  heart disease  history of an ectopic or tubal pregnancy  immune system problems  IUD in place  liver disease or tumor  problems with blood clots or take blood-thinners  seizures  use intravenous drugs  uterus of unusual shape  vaginal bleeding that has not been explained  an unusual or allergic reaction to levonorgestrel, other hormones, silicone, or polyethylene, medicines, foods, dyes, or preservatives  pregnant or trying to get pregnant  breast-feeding How should I use this medicine? This device is placed inside the uterus by a health care professional. Talk to your pediatrician regarding the use of this medicine in children. Special care may be needed. Overdosage: If you think you have taken too much of this medicine contact a poison control center or emergency room at once. NOTE: This medicine  is only for you. Do not share this medicine with others. What if I miss a dose? This does not apply. Depending on the brand of device you have inserted, the device will need to be replaced every 3 to 6 years if you wish to continue using this type of birth control. What may interact with this medicine? Do not take this medicine with any of the following medications:  amprenavir  bosentan  fosamprenavir This medicine may also interact with the following medications:  aprepitant  armodafinil  barbiturate medicines for inducing sleep or treating seizures  bexarotene  boceprevir  griseofulvin  medicines to treat seizures like carbamazepine, ethotoin, felbamate, oxcarbazepine, phenytoin, topiramate  modafinil  pioglitazone  rifabutin  rifampin  rifapentine  some medicines to treat HIV infection like atazanavir, efavirenz, indinavir, lopinavir, nelfinavir, tipranavir, ritonavir  St. John's wort  warfarin This list may not describe all possible interactions. Give your  health care provider a list of all the medicines, herbs, non-prescription drugs, or dietary supplements you use. Also tell them if you smoke, drink alcohol, or use illegal drugs. Some items may interact with your medicine. What should I watch for while using this medicine? Visit your doctor or health care professional for regular check ups. See your doctor if you or your partner has sexual contact with others, becomes HIV positive, or gets a sexual transmitted disease. This product does not protect you against HIV infection (AIDS) or other sexually transmitted diseases. You can check the placement of the IUD yourself by reaching up to the top of your vagina with clean fingers to feel the threads. Do not pull on the threads. It is a good habit to check placement after each menstrual period. Call your doctor right away if you feel more of the IUD than just the threads or if you cannot feel the threads at all. The IUD may come out by itself. You may become pregnant if the device comes out. If you notice that the IUD has come out use a backup birth control method like condoms and call your health care provider. Using tampons will not change the position of the IUD and are okay to use during your period. This IUD can be safely scanned with magnetic resonance imaging (MRI) only under specific conditions. Before you have an MRI, tell your healthcare provider that you have an IUD in place, and which type of IUD you have in place. What side effects may I notice from receiving this medicine? Side effects that you should report to your doctor or health care professional as soon as possible:  allergic reactions like skin rash, itching or hives, swelling of the face, lips, or tongue  fever, flu-like symptoms  genital sores  high blood pressure  no menstrual period for 6 weeks during use  pain, swelling, warmth in the leg  pelvic pain or tenderness  severe or sudden headache  signs of  pregnancy  stomach cramping  sudden shortness of breath  trouble with balance, talking, or walking  unusual vaginal bleeding, discharge  yellowing of the eyes or skin Side effects that usually do not require medical attention (report to your doctor or health care professional if they continue or are bothersome):  acne  breast pain  change in sex drive or performance  changes in weight  cramping, dizziness, or faintness while the device is being inserted  headache  irregular menstrual bleeding within first 3 to 6 months of use  nausea This list may not describe all  possible side effects. Call your doctor for medical advice about side effects. You may report side effects to FDA at 1-800-FDA-1088. Where should I keep my medicine? This does not apply. NOTE: This sheet is a summary. It may not cover all possible information. If you have questions about this medicine, talk to your doctor, pharmacist, or health care provider.  2020 Elsevier/Gold Standard (2018-09-25 13:22:01)

## 2020-04-23 NOTE — Progress Notes (Signed)
I spent 20 minutes dedicated to the care of this patient on the date of this encounter to include pre-visit review of records, face to face time with the patient and post visit ordering of testing.   I have seen, interviewed, and examined the patient in conjunction with the [employer] Nurse Practitioner student and affirm the diagnosis and management plan.   Gunnar Bulla, CNM [facility] 04/23/20 4:08 PM

## 2020-04-23 NOTE — Addendum Note (Signed)
Addended by: Brooke Dare on: 04/23/2020 04:23 PM   Modules accepted: Orders

## 2020-04-24 ENCOUNTER — Telehealth: Payer: Self-pay | Admitting: Family

## 2020-04-24 NOTE — Telephone Encounter (Signed)
Pt was calling to inquire about ADHD test referral. Please advise

## 2020-04-24 NOTE — Telephone Encounter (Signed)
Does patient need psych referral for this on one to [place] [facility]?

## 2020-04-27 LAB — FSH/LH
FSH: 4.4 m[IU]/mL
LH: 7.5 m[IU]/mL

## 2020-04-27 LAB — ANTI MULLERIAN HORMONE: ANTI-MULLERIAN HORMONE (AMH): 4.71 ng/mL

## 2020-04-28 NOTE — Telephone Encounter (Signed)
R,  What is status of referral for adhd testing?  You are THE best!  M

## 2020-05-01 NOTE — Progress Notes (Signed)
[facility]  [phone] [phone]  ID: KHALESSI BLOUGH OB: 27-Sep-1991  MR#: [id number]  IHK#:[id number]  Patient Care Team: [name], FNP as PCP - General (Family Medicine)  I connected with Sueanne Margarita on 05/05/20 at  2:45 PM EDT by video enabled telemedicine visit and verified that I am speaking with the correct person using two identifiers.   I discussed the limitations, risks, security and privacy concerns of performing an evaluation and management service by telemedicine and the availability of in-person appointments. I also discussed with the patient that there may be a patient responsible charge related to this service. The patient expressed understanding and agreed to proceed.   Other persons participating in the visit and their role in the encounter: Patient, MD.  Patient's location: Home. Provider's location: Clinic.  CHIEF COMPLAINT: Family history of breast cancer.  INTERVAL HISTORY: Patient agreed to video assisted telemedicine visit to discuss her genetic testing results and further evaluation.  She continues to feel well and remains asymptomatic.  She has no neurologic complaints.  She denies any recent fevers or illnesses.  She has a good appetite and denies weight loss.  She has no chest pain, shortness of breath, cough, or hemoptysis.  She denies any nausea, vomiting, constipation, or diarrhea.  She has no urinary complaints.  Patient offers no specific complaints today.  REVIEW OF SYSTEMS:   Review of Systems  Constitutional: Negative.  Negative for fever, malaise/fatigue and weight loss.  Respiratory: Negative.  Negative for cough, hemoptysis and shortness of breath.   Cardiovascular: Negative.  Negative for chest pain and leg swelling.  Gastrointestinal: Negative.  Negative for abdominal pain.  Genitourinary: Negative.  Negative for dysuria.  Musculoskeletal: Negative.  Negative for back pain.  Skin:  Negative.  Negative for rash.  Neurological: Negative.  Negative for dizziness, focal weakness, weakness and headaches.  Psychiatric/Behavioral: Negative.  The patient is not nervous/anxious.     As per HPI. Otherwise, a complete review of systems is negative.  PAST MEDICAL HISTORY: Past Medical History:  Diagnosis Date  . Migraines     PAST SURGICAL HISTORY: Past Surgical History:  Procedure Laterality Date  . NO PAST SURGERIES      FAMILY HISTORY: Family History  Problem Relation Age of Onset  . Breast cancer Mother 19  . Mental illness Mother   . Cervical cancer Mother   . Breast cancer Maternal Grandmother 40  . Arthritis Maternal Grandmother   . Hypertension Maternal Grandmother   . Diabetes Maternal Grandmother   . Mental illness Maternal Grandmother   . Colon cancer Maternal Uncle        unsure of age  . Thyroid cancer Maternal Uncle 45  . Cervical cancer Sister 65  . Clotting disorder Neg Hx   . Heart attack Neg Hx   . Arrhythmia Neg Hx   . Sudden Cardiac Death Neg Hx     ADVANCED DIRECTIVES (Y/N):  N  HEALTH MAINTENANCE: Social History   Tobacco Use  . Smoking status: Former Smoker    Types: E-cigarettes  . Smokeless tobacco: Current User  . Tobacco comment: Juul regularly  Substance Use Topics  . Alcohol use: Yes    Alcohol/week: 1.0 - 2.0 standard drinks    Types: 1 - 2 Standard drinks or equivalent per week    Comment: occasional, once per month  . Drug use: No     Colonoscopy:  PAP:  Bone density:  Lipid panel:  No  Known Allergies  Current Outpatient Medications  Medication Sig Dispense Refill  . amphetamine-dextroamphetamine (ADDERALL) 5 MG tablet Take 1 tablet (5 mg total) by mouth 2 (two) times daily. 60 tablet 0  . misoprostol (CYTOTEC) 200 MCG tablet Place two tablets in the vagina the night prior to your next clinic appointment 2 tablet 0  . omeprazole (PRILOSEC) 20 MG capsule Take 1 capsule (20 mg total) by mouth daily. 30  capsule 3  . sertraline (ZOLOFT) 25 MG tablet Take 1 tablet (25 mg total) by mouth daily. 90 tablet 1  . VITAMIN D, CHOLECALCIFEROL, PO Take 1 tablet by mouth every other day.     No current facility-administered medications for this visit.    OBJECTIVE: There were no vitals filed for this visit.   There is no height or weight on file to calculate BMI.    ECOG FS:0 - Asymptomatic  General: Well-developed, well-nourished, no acute distress. HEENT: Normocephalic. Neuro: Alert, answering all questions appropriately. Cranial nerves grossly intact. Psych: Normal affect.  LAB RESULTS:  Lab Results  Component Value Date   NA 136 02/04/2020   K 3.4 (L) 02/04/2020   CL 106 02/04/2020   CO2 24 02/04/2020   GLUCOSE 88 02/04/2020   BUN 16 02/04/2020   CREATININE 0.66 02/04/2020   CALCIUM 8.8 02/04/2020   PROT 7.0 02/04/2020   ALBUMIN 4.2 02/04/2020   AST 17 02/04/2020   ALT 16 02/04/2020   ALKPHOS 65 02/04/2020   BILITOT 0.6 02/04/2020   GFRNONAA 121 01/03/2017   GFRAA 140 01/03/2017    Lab Results  Component Value Date   WBC 9.4 02/04/2020   NEUTROABS 4.9 02/04/2020   HGB 11.4 (L) 02/04/2020   HCT 35.6 (L) 02/04/2020   MCV 80.0 02/04/2020   PLT 227.0 02/04/2020     STUDIES: No results found.  ASSESSMENT: Family history of breast cancer.  PLAN:    1. Family history of breast cancer: Patient reports her maternal grandmother had breast cancer at age of 55, and currently is still living at age 104.  She also reports her mother had breast cancer at the age of 87, and is still alive in her early 58s.  She also has multiple maternal relatives with cervical cancer.  Despite patient's family history, [employer] 23 gene panel was negative.  Results were mailed to patient.  No further intervention is needed.  2.  High risk breast cancer: Per Tyer-Cusick model patient has a greater than 38% chance lifetime risk of developing breast cancer.  She has been instructed at minimum to continue  screening mammograms on a yearly basis.  Unclear the benefit of routine breast MRI given her negative genetic testing.  She has declined prophylactic tamoxifen at this time.  Please refer patient back if there are any questions or concerns. 3.  Birth control: Patient states she is on estrogen-based birth control.  Recommend she discuss with her primary care physician possibly switching to a nonestrogen-based treatment.   I provided 20 minutes of face-to-face video visit time during this encounter which included chart review, counseling, and coordination of care as documented above.   Patient expressed understanding and was in agreement with this plan. She also understands that She can call clinic at any time with any questions, concerns, or complaints.    Lloyd Huger, MD   05/05/2020 11:16 AM

## 2020-05-04 ENCOUNTER — Inpatient Hospital Stay: Payer: 59 | Attending: Oncology | Admitting: Oncology

## 2020-05-04 ENCOUNTER — Telehealth: Payer: Self-pay | Admitting: Family

## 2020-05-04 DIAGNOSIS — Z87891 Personal history of nicotine dependence: Secondary | ICD-10-CM | POA: Insufficient documentation

## 2020-05-04 DIAGNOSIS — Z803 Family history of malignant neoplasm of breast: Secondary | ICD-10-CM | POA: Insufficient documentation

## 2020-05-04 DIAGNOSIS — Z79899 Other long term (current) drug therapy: Secondary | ICD-10-CM | POA: Insufficient documentation

## 2020-05-11 NOTE — Telephone Encounter (Signed)
err

## 2020-05-11 NOTE — Telephone Encounter (Signed)
Pt is scheduled on 05/18/2020 at 01:20pm

## 2020-05-13 ENCOUNTER — Telehealth: Payer: Self-pay | Admitting: Family

## 2020-05-13 NOTE — Telephone Encounter (Signed)
Pt called and stated that [facility] cancelled her appointment that her insurance doesn't cover.

## 2020-05-14 ENCOUNTER — Telehealth: Payer: Self-pay | Admitting: Certified Nurse Midwife

## 2020-05-14 NOTE — Telephone Encounter (Signed)
Left a message to call back.

## 2020-05-14 NOTE — Telephone Encounter (Signed)
Pt returned your call.  

## 2020-05-14 NOTE — Telephone Encounter (Signed)
Pt called and stated that she has a appt on the 24 for a IUD. The pt said she was told to be on her period for the appt. I told the pt that I would send a message and if it was okay to send you a reply on your mychart. The pt verbally understood. Please advise

## 2020-05-14 NOTE — Telephone Encounter (Signed)
Patient may reschedule appointment for today or tomorrow if desired. Or is okay to keep as scheduled since will be the end of her menstrual cycle. Thanks, JML

## 2020-05-15 ENCOUNTER — Ambulatory Visit (INDEPENDENT_AMBULATORY_CARE_PROVIDER_SITE_OTHER): Payer: 59 | Admitting: Certified Nurse Midwife

## 2020-05-15 ENCOUNTER — Other Ambulatory Visit: Payer: Self-pay

## 2020-05-15 ENCOUNTER — Telehealth: Payer: Self-pay | Admitting: Family

## 2020-05-15 ENCOUNTER — Encounter: Payer: Self-pay | Admitting: Certified Nurse Midwife

## 2020-05-15 VITALS — BP 100/67 | HR 60 | Ht 60.0 in | Wt 95.0 lb

## 2020-05-15 DIAGNOSIS — Z538 Procedure and treatment not carried out for other reasons: Secondary | ICD-10-CM

## 2020-05-15 DIAGNOSIS — Z30430 Encounter for insertion of intrauterine contraceptive device: Secondary | ICD-10-CM | POA: Diagnosis not present

## 2020-05-15 NOTE — Telephone Encounter (Signed)
PT was returning call about ADHD

## 2020-05-15 NOTE — Progress Notes (Signed)
Danielle Casey is a 29 y.o. year old [id number] Caucasian female who presents for placement of a Mirena IUD.  BP 100/67   Pulse 60   Ht 5' (1.524 m)   Wt 95 lb (43.1 kg)   LMP 05/14/2020 (Exact Date)   BMI 18.55 kg/m   The risks and benefits of the method and placement have been thouroughly reviewed with the patient and all questions were answered.  Specifically the patient is aware of failure rate of 11/998, expulsion of the IUD and of possible perforation.  The patient is aware of irregular bleeding due to the method and understands the incidence of irregular bleeding diminishes with time.  Signed copy of informed consent in chart.   Time out was performed.  A small plalstic speculum was placed in the vagina.  The cervix was visualized, prepped using Betadine, and grasped with a single tooth tenaculum. The uterus was sounded to 7 cm.  Attempted to placed Mirena IUD placed per manufacturer's recommendations x 2. Cervix resounded and another attempt was made to placed Mirena IUD; however patient desired to discontinue procedure.   The patient was given post procedure instructions.  Reviewed red flag symptoms and when to call.   RTC as previously scheduled or sooner if needed.    Serafina Royals, CNM [facility], [facility] 05/15/20 9:29 AM   NDC: 16144-324-69 Lot: [id number] Exp: 06/2022

## 2020-05-15 NOTE — Telephone Encounter (Signed)
Spoke to patient and gave list of alternative centers for ADHD evaluation. [place] will call back after seeing which one accepts her insurance. States that [address],  [address] is the one that does not accept her insurance.

## 2020-05-15 NOTE — Patient Instructions (Signed)
IUD PLACEMENT POST-PROCEDURE INSTRUCTIONS  1. You may take Ibuprofen, Aleve or Tylenol for pain if needed.  Cramping should resolve within in 24 hours.  2. You may have a small amount of spotting.  You should wear a mini pad for the next few days.  3. You may have intercourse after 24 hours.  If you using this for birth control, it is effective immediately.  4. You need to call if you have any pelvic pain, fever, heavy bleeding or foul smelling vaginal discharge.  Irregular bleeding is common the first several months after having an IUD placed. You do not need to call for this reason unless you are concerned.  5. Shower or bathe as normal  6. You should have a follow-up appointment in 4-8 weeks for a re-check to make sure you are not having any problems.  Levonorgestrel intrauterine device (IUD) What is this medicine? LEVONORGESTREL IUD (LEE voe nor jes trel) is a contraceptive (birth control) device. The device is placed inside the uterus by a healthcare professional. It is used to prevent pregnancy. This device can also be used to treat heavy bleeding that occurs during your period. This medicine may be used for other purposes; ask your health care provider or pharmacist if you have questions. COMMON BRAND NAME(S): Kyleena, LILETTA, Mirena, Skyla What should I tell my health care provider before I take this medicine? They need to know if you have any of these conditions:  abnormal Pap smear  cancer of the breast, uterus, or cervix  diabetes  endometritis  genital or pelvic infection now or in the past  have more than one sexual partner or your partner has more than one partner  heart disease  history of an ectopic or tubal pregnancy  immune system problems  IUD in place  liver disease or tumor  problems with blood clots or take blood-thinners  seizures  use intravenous drugs  uterus of unusual shape  vaginal bleeding that has not been explained  an unusual or  allergic reaction to levonorgestrel, other hormones, silicone, or polyethylene, medicines, foods, dyes, or preservatives  pregnant or trying to get pregnant  breast-feeding How should I use this medicine? This device is placed inside the uterus by a health care professional. Talk to your pediatrician regarding the use of this medicine in children. Special care may be needed. Overdosage: If you think you have taken too much of this medicine contact a poison control center or emergency room at once. NOTE: This medicine is only for you. Do not share this medicine with others. What if I miss a dose? This does not apply. Depending on the brand of device you have inserted, the device will need to be replaced every 3 to 6 years if you wish to continue using this type of birth control. What may interact with this medicine? Do not take this medicine with any of the following medications:  amprenavir  bosentan  fosamprenavir This medicine may also interact with the following medications:  aprepitant  armodafinil  barbiturate medicines for inducing sleep or treating seizures  bexarotene  boceprevir  griseofulvin  medicines to treat seizures like carbamazepine, ethotoin, felbamate, oxcarbazepine, phenytoin, topiramate  modafinil  pioglitazone  rifabutin  rifampin  rifapentine  some medicines to treat HIV infection like atazanavir, efavirenz, indinavir, lopinavir, nelfinavir, tipranavir, ritonavir  St. John's wort  warfarin This list may not describe all possible interactions. Give your health care provider a list of all the medicines, herbs, non-prescription drugs, or dietary   supplements you use. Also tell them if you smoke, drink alcohol, or use illegal drugs. Some items may interact with your medicine. What should I watch for while using this medicine? Visit your doctor or health care professional for regular check ups. See your doctor if you or your partner has sexual  contact with others, becomes HIV positive, or gets a sexual transmitted disease. This product does not protect you against HIV infection (AIDS) or other sexually transmitted diseases. You can check the placement of the IUD yourself by reaching up to the top of your vagina with clean fingers to feel the threads. Do not pull on the threads. It is a good habit to check placement after each menstrual period. Call your doctor right away if you feel more of the IUD than just the threads or if you cannot feel the threads at all. The IUD may come out by itself. You may become pregnant if the device comes out. If you notice that the IUD has come out use a backup birth control method like condoms and call your health care provider. Using tampons will not change the position of the IUD and are okay to use during your period. This IUD can be safely scanned with magnetic resonance imaging (MRI) only under specific conditions. Before you have an MRI, tell your healthcare provider that you have an IUD in place, and which type of IUD you have in place. What side effects may I notice from receiving this medicine? Side effects that you should report to your doctor or health care professional as soon as possible:  allergic reactions like skin rash, itching or hives, swelling of the face, lips, or tongue  fever, flu-like symptoms  genital sores  high blood pressure  no menstrual period for 6 weeks during use  pain, swelling, warmth in the leg  pelvic pain or tenderness  severe or sudden headache  signs of pregnancy  stomach cramping  sudden shortness of breath  trouble with balance, talking, or walking  unusual vaginal bleeding, discharge  yellowing of the eyes or skin Side effects that usually do not require medical attention (report to your doctor or health care professional if they continue or are bothersome):  acne  breast pain  change in sex drive or performance  changes in  weight  cramping, dizziness, or faintness while the device is being inserted  headache  irregular menstrual bleeding within first 3 to 6 months of use  nausea This list may not describe all possible side effects. Call your doctor for medical advice about side effects. You may report side effects to FDA at 1-800-FDA-1088. Where should I keep my medicine? This does not apply. NOTE: This sheet is a summary. It may not cover all possible information. If you have questions about this medicine, talk to your doctor, pharmacist, or health care provider.  2020 Elsevier/Gold Standard (2018-09-25 13:22:01)  

## 2020-05-15 NOTE — Telephone Encounter (Signed)
Call pt  Advise her to call these other locations to see what her insurance covers and let us know if she needs new referral. Ask her to let us know when appt is scheduled so that I can note in chart/   ADHD evaluation:   [facility] in [place]: [phone] [facility] [phone] [facility] in [place]: [phone]

## 2020-05-15 NOTE — Telephone Encounter (Signed)
Left message to call back  

## 2020-05-15 NOTE — Telephone Encounter (Signed)
Danielle Casey called back. Would like a new referral to [facility] in [place].

## 2020-05-18 ENCOUNTER — Other Ambulatory Visit: Payer: Self-pay

## 2020-05-18 ENCOUNTER — Encounter: Payer: Self-pay | Admitting: Family

## 2020-05-18 ENCOUNTER — Telehealth (INDEPENDENT_AMBULATORY_CARE_PROVIDER_SITE_OTHER): Payer: 59 | Admitting: Family

## 2020-05-18 DIAGNOSIS — Z0000 Encounter for general adult medical examination without abnormal findings: Secondary | ICD-10-CM | POA: Diagnosis not present

## 2020-05-18 DIAGNOSIS — R21 Rash and other nonspecific skin eruption: Secondary | ICD-10-CM | POA: Diagnosis not present

## 2020-05-18 DIAGNOSIS — R41840 Attention and concentration deficit: Secondary | ICD-10-CM | POA: Diagnosis not present

## 2020-05-18 DIAGNOSIS — F419 Anxiety disorder, unspecified: Secondary | ICD-10-CM

## 2020-05-18 DIAGNOSIS — R0789 Other chest pain: Secondary | ICD-10-CM | POA: Diagnosis not present

## 2020-05-18 NOTE — Patient Instructions (Signed)
Continue prilosec  Referral to dermatology . Let us know if you dont hear back within a week in regards to an appointment being scheduled.

## 2020-05-18 NOTE — Assessment & Plan Note (Signed)
Stable, continue current regimen 

## 2020-05-18 NOTE — Assessment & Plan Note (Signed)
Awaiting appointment with [facility].  Doing well on current regimen, will continue.  I looked up patient on [place] Controlled Substances Reporting System PMP AWARE and saw no activity that raised concern of inappropriate use.

## 2020-05-18 NOTE — Telephone Encounter (Signed)
Pt's insurance covers [facility] in [place]  Can we send her there for adhd evaluation?

## 2020-05-18 NOTE — Assessment & Plan Note (Signed)
Etiology nonspecific at this time.  Certainly limited nature of telephone appointment.  Discussed with patient differentials including celiac disease, psoriasis, eczema.  She would like referral dermatology prior to any further work-up.  Referral is in place.

## 2020-05-18 NOTE — Progress Notes (Signed)
Verbal consent for services obtained from patient prior to services given to TELEPHONE visit:   Location of call:  provider at work patient at home  Names of all persons present for services: [name], NP and patient Chief complaint:  Follow-up with chest tightness, improved and feels less frequent since starting  prilosec. Occurred 1-2 in two weeks whereas had been 1-2 per week prior too.   ADHD- feels well on adderall. Would like refill. Working on appointment with [facility] in [place].   Anxiety-stable. Feels well zoloft.   Would like to be tested for gluten. Eaten tortillas - not sure if corn or wheat.  Had a rash which has happened several times before. Typically gets rash around wrists. Very itchy. Raised 'very small' red spots, resolve on their own after one week. No dry itchy patches, nail discoloration.   No fever, joint pain, joint swelling. No autoimmune disease in family.   Negative ANA in 2019.   History, background, results pertinent:  Former smoker. Stopped smoking at 21 years, started at age 30 years. No h/o asthma Working out more and feels well. No wheezing, sob, cough.  A/P/next steps: Problem List Items Addressed This Visit      Musculoskeletal and Integument   Rash    Etiology nonspecific at this time.  Certainly limited nature of telephone appointment.  Discussed with patient differentials including celiac disease, psoriasis, eczema.  She would like referral dermatology prior to any further work-up.  Referral is in place.      Relevant Orders   Ambulatory referral to Dermatology     Other   Anxiety    Stable, continue current regimen      Chest tightness    Very pleased to hear frequency, severity has improved on Prilosec after just 2 weeks.  She will continue Prilosec.   She declined declines a further work-up at this time.       Concentration deficit    Awaiting appointment with [facility].  Doing  well on current regimen, will continue.  I looked up patient on [place] Controlled Substances Reporting System PMP AWARE and saw no activity that raised concern of inappropriate use.        Routine physical examination       I spent 21 min  discussing plan of care over the phone.

## 2020-05-18 NOTE — Assessment & Plan Note (Signed)
Very pleased to hear frequency, severity has improved on Prilosec after just 2 weeks.  She will continue Prilosec.   She declined declines a further work-up at this time.

## 2020-05-19 ENCOUNTER — Telehealth: Payer: Self-pay | Admitting: Family

## 2020-05-19 NOTE — Telephone Encounter (Signed)
PT called and stated [facility] and [facility] medication is  amphetamine-dextroamphetamine (ADDERALL) 5 MG tablet

## 2020-05-20 NOTE — Telephone Encounter (Signed)
Left message to call back  

## 2020-05-20 NOTE — Telephone Encounter (Signed)
Please confirm [facility] medication We need name, address   Remind pt that refill may [facility] new [facility] so she will need to ask [place] to change it

## 2020-05-21 ENCOUNTER — Ambulatory Visit: Payer: 59 | Admitting: Certified Nurse Midwife

## 2020-05-21 ENCOUNTER — Encounter: Payer: Self-pay | Admitting: Family

## 2020-05-21 NOTE — Telephone Encounter (Signed)
Left message to call back  

## 2020-05-21 NOTE — Telephone Encounter (Signed)
I spoke with pt she suggested referral being sent to [facility].

## 2020-05-21 NOTE — Telephone Encounter (Signed)
See Telephone encounter for 05/21/20

## 2020-05-22 NOTE — Telephone Encounter (Signed)
Sounds good If you need a new referral, let me know

## 2020-05-22 NOTE — Telephone Encounter (Signed)
Will do. we're good for now. Thank you!

## 2020-06-11 ENCOUNTER — Telehealth: Payer: Self-pay | Admitting: Family

## 2020-06-11 NOTE — Telephone Encounter (Signed)
Called patient three times,with no return call to schedule. Currently we are  Scheduling into December   Thank you for the referral [facility]

## 2020-06-22 ENCOUNTER — Encounter: Payer: Self-pay | Admitting: Family

## 2020-06-30 ENCOUNTER — Other Ambulatory Visit: Payer: Self-pay | Admitting: Family

## 2020-06-30 ENCOUNTER — Telehealth: Payer: Self-pay | Admitting: Family

## 2020-06-30 DIAGNOSIS — R41840 Attention and concentration deficit: Secondary | ICD-10-CM

## 2020-06-30 DIAGNOSIS — Z0000 Encounter for general adult medical examination without abnormal findings: Secondary | ICD-10-CM

## 2020-06-30 MED ORDER — AMPHETAMINE-DEXTROAMPHETAMINE 5 MG PO TABS: 5.0000 mg | ORAL_TABLET | Freq: Two times a day (BID) | ORAL | 0 refills | Status: DC

## 2020-06-30 NOTE — Telephone Encounter (Signed)
close

## 2020-07-30 ENCOUNTER — Encounter: Payer: Self-pay | Admitting: Dermatology

## 2020-07-30 ENCOUNTER — Ambulatory Visit (INDEPENDENT_AMBULATORY_CARE_PROVIDER_SITE_OTHER): Payer: 59 | Admitting: Dermatology

## 2020-07-30 ENCOUNTER — Other Ambulatory Visit: Payer: Self-pay

## 2020-07-30 DIAGNOSIS — R21 Rash and other nonspecific skin eruption: Secondary | ICD-10-CM | POA: Diagnosis not present

## 2020-07-30 DIAGNOSIS — L709 Acne, unspecified: Secondary | ICD-10-CM

## 2020-07-30 MED ORDER — TRIAMCINOLONE ACETONIDE 0.1 % EX OINT: TOPICAL_OINTMENT | CUTANEOUS | 2 refills | Status: DC

## 2020-07-30 MED ORDER — TRETINOIN 0.025 % EX CREA: TOPICAL_CREAM | CUTANEOUS | 3 refills | Status: DC

## 2020-07-30 NOTE — Patient Instructions (Addendum)
Recommend daily broad spectrum sunscreen SPF 30+ to sun-exposed areas, reapply every 2 hours as needed. Call for new or changing lesions.  Topical steroids (such as triamcinolone, fluocinolone, fluocinonide, mometasone, clobetasol, halobetasol, betamethasone, hydrocortisone) can cause thinning and lightening of the skin if they are used for too long in the same area. Your physician has selected the right strength medicine for your problem and area affected on the body. Please use your medication only as directed by your physician to prevent side effects.   Topical retinoid medications like tretinoin/Retin-A, adapalene/Differin, tazarotene/Fabior, and Epiduo/Epiduo Forte can cause dryness and irritation when first started. Only apply a pea-sized amount to the entire affected area. Avoid applying it around the eyes, edges of mouth and creases at the nose. If you experience irritation, use a good moisturizer first and/or apply the medicine less often. If you are doing well with the medicine, you can increase how often you use it until you are applying every night. Be careful with sun protection while using this medication as it can make you sensitive to the sun. This medicine should not be used by pregnant women.   Avoid applying to face, groin, and axilla. Use as directed. Risk of skin atrophy with long-term use reviewed.

## 2020-07-30 NOTE — Progress Notes (Signed)
   New Patient Visit  Subjective  Danielle Casey is a 29 y.o. female who presents for the following: New Patient (Initial Visit) (Rash).  Patient presents today as a new patient for a recurrent rash on her bilateral hands that tend to extend up the forearm. It is extremely itchy. This has happened on and off for past few years, Most recent occurrence was early August. Patient has not really tried any topical or oral medications for this.  She also has acne for which she is not using treatment.  The following portions of the chart were reviewed this encounter and updated as appropriate:  Tobacco  Allergies  Meds  Problems  Med Hx  Surg Hx  Fam Hx      Review of Systems:  No other skin or systemic complaints except as noted in HPI or Assessment and Plan.  Objective  Well appearing patient in no apparent distress; mood and affect are within normal limits.  A focused examination was performed including face, chest, back, bilateral hands, bilateral arms, nails. Relevant physical exam findings are noted in the Assessment and Plan.  Objective  Right Forearm - Anterior: Photos show coalescing erythematous papules and plaques.  Objective  Left Malar Cheek: Trace open comedones at face few inflammatory papules chest with few inflammatory and trace open comedones with scattered inflammatory papules on back   Assessment & Plan  Rash Right Forearm - Anterior  Favor papular eczema or allergic contact dermatitis  Start  Triamcinolone ointment apply to affected areas twice daily on hands and forearms as needed for rash  Will do patch testing   Topical steroids (such as triamcinolone, fluocinolone, fluocinonide, mometasone, clobetasol, halobetasol, betamethasone, hydrocortisone) can cause thinning and lightening of the skin if they are used for too long in the same area. Your physician has selected the right strength medicine for your problem and area affected on the body. Please use  your medication only as directed by your physician to prevent side effects.  . Avoid applying to face, groin, and axilla. Use as directed. Risk of skin atrophy with long-term use reviewed.    triamcinolone ointment (KENALOG) 0.1 % - Right Forearm - Anterior  Acne, unspecified acne type Left Malar Cheek  Chronic, flared.  Start tretinoin 0.025% apply to affected areas QHS and follow with moisturizer. Start every other day for 2 weeks then daily as tolerated  Recommend Argan oil at night as moisturizer  Topical retinoid medications like tretinoin/Retin-A, adapalene/Differin, tazarotene/Fabior, and Epiduo/Epiduo Forte can cause dryness and irritation when first started. Only apply a pea-sized amount to the entire affected area. Avoid applying it around the eyes, edges of mouth and creases at the nose. If you experience irritation, use a good moisturizer first and/or apply the medicine less often. If you are doing well with the medicine, you can increase how often you use it until you are applying every night. Be careful with sun protection while using this medication as it can make you sensitive to the sun. This medicine should not be used by pregnant women.    tretinoin (RETIN-A) 0.025 % cream - Left Malar Cheek  Return for patch testing on Tuesday, 2 months for ACNE.  I, Leward Quan, CMA, am acting as scribe for Darden Dates, MD .  Documentation: I have reviewed the above documentation for accuracy and completeness, and I agree with the above.  Darden Dates, MD

## 2020-08-04 ENCOUNTER — Ambulatory Visit (INDEPENDENT_AMBULATORY_CARE_PROVIDER_SITE_OTHER): Payer: 59

## 2020-08-04 ENCOUNTER — Other Ambulatory Visit: Payer: Self-pay

## 2020-08-04 DIAGNOSIS — R21 Rash and other nonspecific skin eruption: Secondary | ICD-10-CM

## 2020-08-04 NOTE — Progress Notes (Signed)
Patient here today to apply True Test X36 to back.  Panels 1-3 applied to patients upper back. Advised patient to keep back dry until she returns on Thursday to remove panels and review testing.

## 2020-08-06 ENCOUNTER — Ambulatory Visit (INDEPENDENT_AMBULATORY_CARE_PROVIDER_SITE_OTHER): Payer: 59

## 2020-08-06 ENCOUNTER — Other Ambulatory Visit: Payer: Self-pay

## 2020-08-06 DIAGNOSIS — R21 Rash and other nonspecific skin eruption: Secondary | ICD-10-CM | POA: Diagnosis not present

## 2020-08-06 NOTE — Progress Notes (Signed)
Patient here for 3 day True Test reading. Patient showed no signs of any reactions. Advised to keep back as dry as possible, do not soak or scrub.  Patient understands how to read until she returns for appointment next week.

## 2020-08-11 ENCOUNTER — Encounter: Payer: Self-pay | Admitting: Dermatology

## 2020-08-12 ENCOUNTER — Other Ambulatory Visit: Payer: Self-pay

## 2020-08-12 ENCOUNTER — Ambulatory Visit (INDEPENDENT_AMBULATORY_CARE_PROVIDER_SITE_OTHER): Payer: 59 | Admitting: Dermatology

## 2020-08-12 DIAGNOSIS — R21 Rash and other nonspecific skin eruption: Secondary | ICD-10-CM

## 2020-08-12 NOTE — Progress Notes (Signed)
   Follow-Up Visit   Subjective  Danielle Casey is a 29 y.o. female who presents for the following: Patch Test (Patient here today for 1 week patch test follow up. ).  She did not have any reaction.  The following portions of the chart were reviewed this encounter and updated as appropriate:  Tobacco  Allergies  Meds  Problems  Med Hx  Surg Hx  Fam Hx      Review of Systems:  No other skin or systemic complaints except as noted in HPI or Assessment and Plan.  Objective  Well appearing patient in no apparent distress; mood and affect are within normal limits.  A focused examination was performed including back. Relevant physical exam findings are noted in the Assessment and Plan.  Objective  Arms, hands: Back clear.  Thin scaly pink plaques at wrists   Assessment & Plan  Rash Arms, hands  Fever eczema.  However allergic contact dermatitis is still in the differential.  May consider referring to [facility] for more extensive allergy testing if does not respond well to therapy.  Cont TMC 0.1% ointment twice daily to arms and hands as needed. Avoid applying to face, groin, and axilla. Use as directed. Risk of skin atrophy with long-term use reviewed.    Other Related Medications triamcinolone ointment (KENALOG) 0.1 %  Return 4-6 months, for rash follow up.  Anise Salvo, RMA, am acting as scribe for Darden Dates, MD .  Documentation: I have reviewed the above documentation for accuracy and completeness, and I agree with the above.  Darden Dates, MD

## 2020-08-12 NOTE — Patient Instructions (Signed)
Gentle Skin Care Guide  1. Bathe no more than once a day.  2. Avoid bathing in hot water  3. Use a mild soap like Dove, Vanicream, Cetaphil, CeraVe. Can use Lever 2000 or Cetaphil antibacterial soap  4. Use soap only where you need it. On most days, use it under your arms, between your legs, and on your feet. Let the water rinse other areas unless visibly dirty.  5. When you get out of the bath/shower, use a towel to gently blot your skin dry, don't rub it.  6. While your skin is still a little damp, apply a moisturizing cream such as Vanicream, CeraVe, Cetaphil, Eucerin, Sarna lotion or plain Vaseline Jelly. For hands apply Neutrogena Norwegian Hand Cream or Excipial Hand Cream.  7. Reapply moisturizer any time you start to itch or feel dry.  8. Sometimes using free and clear laundry detergents can be helpful. Fabric softener sheets should be avoided. Downy Free & Gentle liquid, or any liquid fabric softener that is free of dyes and perfumes, it acceptable to use  9. If your doctor has given you prescription creams you may apply moisturizers over them   Topical steroids (such as triamcinolone, fluocinolone, fluocinonide, mometasone, clobetasol, halobetasol, betamethasone, hydrocortisone) can cause thinning and lightening of the skin if they are used for too long in the same area. Your physician has selected the right strength medicine for your problem and area affected on the body. Please use your medication only as directed by your physician to prevent side effects.   

## 2020-08-24 ENCOUNTER — Encounter: Payer: Self-pay | Admitting: Dermatology

## 2020-08-25 ENCOUNTER — Other Ambulatory Visit: Payer: Self-pay | Admitting: Family

## 2020-08-25 DIAGNOSIS — R0789 Other chest pain: Secondary | ICD-10-CM

## 2020-09-15 ENCOUNTER — Ambulatory Visit
Admission: RE | Admit: 2020-09-15 | Discharge: 2020-09-15 | Disposition: A | Discharge status: Home / Self Care | Payer: 59 | Source: Ambulatory Visit | Attending: Family | Admitting: Family

## 2020-09-15 ENCOUNTER — Other Ambulatory Visit: Payer: Self-pay

## 2020-09-15 ENCOUNTER — Telehealth: Payer: Self-pay

## 2020-09-15 DIAGNOSIS — Z803 Family history of malignant neoplasm of breast: Secondary | ICD-10-CM

## 2020-09-15 DIAGNOSIS — N6322 Unspecified lump in the left breast, upper inner quadrant: Secondary | ICD-10-CM | POA: Insufficient documentation

## 2020-09-15 DIAGNOSIS — Z1239 Encounter for other screening for malignant neoplasm of breast: Secondary | ICD-10-CM | POA: Diagnosis not present

## 2020-09-15 MED ORDER — GADOBUTROL 1 MMOL/ML IV SOLN
4.0000 mL | Freq: Once | INTRAVENOUS | Status: AC | PRN
  Administered 2020-09-15: 4 mL via INTRAVENOUS

## 2020-09-15 NOTE — Telephone Encounter (Signed)
LMTCB to see if pt contacted by [place].

## 2020-09-16 NOTE — Telephone Encounter (Signed)
I called patient & she has not been contacted by [facility] yet. She will call to make sure that she gets scheduled.

## 2020-09-16 NOTE — Telephone Encounter (Signed)
Pt called back returning your call She stated that she hasn't contact [place] yet and wanted to discuss further

## 2020-09-17 ENCOUNTER — Other Ambulatory Visit: Payer: Self-pay | Admitting: Family

## 2020-09-17 ENCOUNTER — Telehealth: Payer: Self-pay

## 2020-09-17 ENCOUNTER — Encounter: Payer: Self-pay | Admitting: Family

## 2020-09-17 DIAGNOSIS — R9389 Abnormal findings on diagnostic imaging of other specified body structures: Secondary | ICD-10-CM

## 2020-09-17 NOTE — Telephone Encounter (Signed)
Pt called in and stated that she would like a call back. The pt got a bill that she has some questions about. The pt said that she was charged for a IUD. I told the pt I will send a message and to please allow 24- 48 hours for a reply.

## 2020-09-21 NOTE — Telephone Encounter (Signed)
LM informing pt that I needed to reach out to the billing team to get this resolved.   Danielle Casey is on pal until 10/29. Advised pt that I would contact her once I heard from [place].

## 2020-10-01 NOTE — Telephone Encounter (Signed)
Mailbox full- unable to leave a message.   Per Byrd Hesselbach the modifer 53 was added to the e/m code and it should have been added to the insert intrauterine device code.   She will have to resubmit the claim.

## 2020-10-06 ENCOUNTER — Ambulatory Visit: Payer: Self-pay | Admitting: Dermatology

## 2020-10-06 ENCOUNTER — Other Ambulatory Visit: Payer: Self-pay | Admitting: Family

## 2020-10-06 DIAGNOSIS — R41840 Attention and concentration deficit: Secondary | ICD-10-CM

## 2020-10-06 DIAGNOSIS — Z0000 Encounter for general adult medical examination without abnormal findings: Secondary | ICD-10-CM

## 2020-10-07 ENCOUNTER — Ambulatory Visit
Admission: RE | Admit: 2020-10-07 | Discharge: 2020-10-07 | Disposition: A | Discharge status: Home / Self Care | Payer: 59 | Source: Ambulatory Visit | Attending: Family | Admitting: Family

## 2020-10-07 ENCOUNTER — Other Ambulatory Visit (HOSPITAL_COMMUNITY): Payer: Self-pay | Admitting: Diagnostic Radiology

## 2020-10-07 ENCOUNTER — Other Ambulatory Visit: Payer: Self-pay

## 2020-10-07 ENCOUNTER — Telehealth: Payer: Self-pay | Admitting: Family

## 2020-10-07 DIAGNOSIS — Z0000 Encounter for general adult medical examination without abnormal findings: Secondary | ICD-10-CM

## 2020-10-07 DIAGNOSIS — R41840 Attention and concentration deficit: Secondary | ICD-10-CM

## 2020-10-07 DIAGNOSIS — R9389 Abnormal findings on diagnostic imaging of other specified body structures: Secondary | ICD-10-CM

## 2020-10-07 MED ORDER — AMPHETAMINE-DEXTROAMPHETAMINE 5 MG PO TABS: 5.0000 mg | ORAL_TABLET | Freq: Two times a day (BID) | ORAL | 0 refills | Status: DC

## 2020-10-07 MED ORDER — GADOBUTROL 1 MMOL/ML IV SOLN
4.0000 mL | Freq: Once | INTRAVENOUS | Status: AC | PRN
  Administered 2020-10-07: 4 mL via INTRAVENOUS

## 2020-10-07 NOTE — Telephone Encounter (Signed)
I called patient & VM was full, so I could not LM. I have sent patient a mychart message.

## 2020-10-07 NOTE — Telephone Encounter (Signed)
Call pt   I have refilled your adderall  However I wanted to remind you that this is controlled substance.   In order for me to prescribe medication,  patients must be seen every 3 months.   Please make follow-up appointment   I looked up patient on [place] Controlled Substances Reporting System and saw no activity that raised concern of inappropriate use.

## 2020-10-08 ENCOUNTER — Ambulatory Visit: Payer: 59 | Admitting: Dermatology

## 2020-10-12 NOTE — Telephone Encounter (Signed)
LMTRC

## 2020-10-16 NOTE — Telephone Encounter (Signed)
Called pt x 3. NO response. Chart closed.

## 2020-10-20 ENCOUNTER — Telehealth: Payer: Self-pay

## 2020-10-20 DIAGNOSIS — Z111 Encounter for screening for respiratory tuberculosis: Secondary | ICD-10-CM

## 2020-10-20 NOTE — Telephone Encounter (Signed)
Pt dropped off form to be filled out. Placed in folder up front

## 2020-10-20 NOTE — Telephone Encounter (Signed)
Pt states that she needs some vaccinations for school. Please call her back to advise

## 2020-10-20 NOTE — Telephone Encounter (Signed)
Patient will drop off paperwork with list of immunizations to be filled out. She will need quantiferon gold drawn as opposed to TB skin test. Are you okay with my ordering?

## 2020-10-26 NOTE — Telephone Encounter (Signed)
I have filled out if you will check behind me. Pt needs Danielle Casey ordered to have drawn. Placed in your red folder.

## 2020-10-27 NOTE — Telephone Encounter (Signed)
LM that we had patient's paperwork & that she needed to call back to schedule lab draw.

## 2020-10-27 NOTE — Addendum Note (Signed)
Addended by: [name] on: 10/27/2020 01:35 PM   Modules accepted: Orders

## 2020-10-27 NOTE — Telephone Encounter (Signed)
Call pt sch quantiferon gold lab She needs MMR vaccine as absent titers, advise to have done at health dept

## 2020-10-28 NOTE — Telephone Encounter (Signed)
Just FYI titers were drawn before patient had MMR vaccine 03/22/20. She is scheduled to have Quantiferon Gold drawn here tomorrow.

## 2020-10-28 NOTE — Telephone Encounter (Signed)
Pt returned your call and said she has questions and need a call back. She didn't schedule the lab appt she said she will wait on your call first.

## 2020-10-29 ENCOUNTER — Other Ambulatory Visit: Payer: Self-pay

## 2020-10-29 ENCOUNTER — Other Ambulatory Visit (INDEPENDENT_AMBULATORY_CARE_PROVIDER_SITE_OTHER): Payer: 59

## 2020-10-29 DIAGNOSIS — Z111 Encounter for screening for respiratory tuberculosis: Secondary | ICD-10-CM

## 2020-10-31 LAB — QUANTIFERON-TB GOLD PLUS
Mitogen-NIL: >10 IU/mL
NIL: 0.02 IU/mL
QuantiFERON-TB Gold Plus: NEGATIVE
TB1-NIL: 0 IU/mL
TB2-NIL: 0.01 IU/mL

## 2020-11-03 ENCOUNTER — Telehealth: Payer: Self-pay | Admitting: Family

## 2020-11-03 NOTE — Progress Notes (Deleted)
Virtual Visit via Video Note  I connected with@  on 11/03/20 at  4:00 PM EST by a video enabled telemedicine application and verified that I am speaking with the correct person using two identifiers.  Location patient: home Location provider:work  Persons participating in the virtual visit: patient, provider  I discussed the limitations of evaluation and management by telemedicine and the availability of in person appointments. The patient expressed understanding and agreed to proceed.   HPI:  Discuss staff message Since her MRI Bx was mid November, her 6 month post-biopsy MRI should be mid May-June.   I think it would be fine if you want to push her MRI out a few months to work her toward being checked every 6 months, once by mammogram, once by MRI.     ROS: See pertinent positives and negatives per HPI.    EXAM:  VITALS per patient if applicable: There were no vitals taken for this visit. BP Readings from Last 3 Encounters:  05/15/20 100/67  04/23/20 118/83  04/22/20 98/66   Wt Readings from Last 3 Encounters:  05/18/20 95 lb 3.2 oz (43.2 kg)  05/15/20 95 lb (43.1 kg)  04/23/20 94 lb 5 oz (42.8 kg)    GENERAL: alert, oriented, appears well and in no acute distress  HEENT: atraumatic, conjunttiva clear, no obvious abnormalities on inspection of external nose and ears  NECK: normal movements of the head and neck  LUNGS: on inspection no signs of respiratory distress, breathing rate appears normal, no obvious gross SOB, gasping or wheezing  CV: no obvious cyanosis  MS: moves all visible extremities without noticeable abnormality  PSYCH/NEURO: pleasant and cooperative, no obvious depression or anxiety, speech and thought processing grossly intact  ASSESSMENT AND PLAN:  Discussed the following assessment and plan:  Problem List Items Addressed This Visit    None      -we discussed possible serious and likely etiologies, options for evaluation and workup,  limitations of telemedicine visit vs in person visit, treatment, treatment risks and precautions. Pt prefers to treat via telemedicine empirically rather then risking or undertaking an in person visit at this moment.  .   I discussed the assessment and treatment plan with the patient. The patient was provided an opportunity to ask questions and all were answered. The patient agreed with the plan and demonstrated an understanding of the instructions.   The patient was advised to call back or seek an in-person evaluation if the symptoms worsen or if the condition fails to improve as anticipated.   [name], FNP

## 2020-11-03 NOTE — Telephone Encounter (Signed)
-----   Message from Tollie Eth, RN sent at 10/14/2020  6:21 AM EST ----- Regarding: RE: UPDATE Good morning Claris Che,  Sorry for the delayed response, I was off a few days, and have been playing catch up ever since.  She is due for her annual screening mammogram beginning 02/26/21.  Since her MRI Bx was mid November, her 6 month post-biopsy MRI should be mid May-June.  I think it would be fine if you want to push her MRI out a few months to work her toward being checked every 6 months, once by mammogram, once by MRI.  I hope this is helpful and makes sense.  Larey Seat free to contact me with any questions or concerns.  Orlie Pollen ----- Message ----- From: [name], FNP Sent: 10/09/2020   8:44 AM EST To: Tollie Eth, RN Subject: RE: Brandon Melnick, you are on it!  Question for you- I had planned to do mammogram in 6 months time as well as I wanted to alternate every 6 months with mammogram and MRI breasts.   Do you advise repeating breast MRI and doing mammogram at the same time OR doing mammogram 6 months after MRI breasts?     ----- Message ----- From: Tollie Eth, RN Sent: 10/09/2020   8:11 AM EST To: [name], FNP Subject: UPDATE                                         Ms. Saravia just called back, she now has results and stated she has been having phone issues.  Thank you,  Orlie Pollen ----- Message ----- From: Tollie Eth, RN Sent: 10/09/2020   7:30 AM EST To: [name], FNP Subject: MR Bx results                                  Good morning,  Ms. Bump had a MRI guided biopsy done with [facility] on 10/07/20.  Pathology revealed:   Breast, left, needle core biopsy, left - HAMARTOMATOUS LESION, concordant per Dr. Rosalie Gums with a recommendation to return for a breast MRI in 6 months.  I have made multiple unsuccessful attempts to contact the patient.  Her voice mail is  constantly full.  Please contact your patient so she will be aware of the great news awaiting her.  Larey Seat free to contact me with any questions or concerns.  Thank you,  Rene Kocher RN, RNFA Nurse Navigator [facility] [phone]

## 2020-11-04 ENCOUNTER — Other Ambulatory Visit: Payer: Self-pay

## 2020-11-04 ENCOUNTER — Encounter: Payer: Self-pay | Admitting: Family

## 2020-11-04 ENCOUNTER — Ambulatory Visit (INDEPENDENT_AMBULATORY_CARE_PROVIDER_SITE_OTHER): Payer: 59 | Admitting: Family

## 2020-11-04 DIAGNOSIS — Z803 Family history of malignant neoplasm of breast: Secondary | ICD-10-CM | POA: Diagnosis not present

## 2020-11-04 DIAGNOSIS — R41840 Attention and concentration deficit: Secondary | ICD-10-CM | POA: Diagnosis not present

## 2020-11-04 DIAGNOSIS — F419 Anxiety disorder, unspecified: Secondary | ICD-10-CM | POA: Diagnosis not present

## 2020-11-04 DIAGNOSIS — D649 Anemia, unspecified: Secondary | ICD-10-CM

## 2020-11-04 NOTE — Progress Notes (Signed)
Subjective:    Patient ID: Danielle Casey, female    DOB: 17-Jan-1991, 29 y.o.   MRN: [id number]  CC: Danielle Casey is a 29 y.o. female who presents today for follow up.   HPI: GAD - overall unchanged. Mood feels better on zoloft 25mg  however no difference on zoloft 25mg . Has missed doses. Continues to struggle to fall asleep.    Would like to trial stop as noticed that since being on the zoloft that she has apprehension when writing certain letters. Not sure if related nor if related to anxiety when another person is watching her write .  ADHD- Feels well on adderall 5mg  and takes either daily or BID.  Has appointment for re-evaluation of ADHD in 12/2020.  No cp, sob, palpitations.  MR breasts repeat due Danielle Casey 2022. Plan to do mammogram in November 2022 which is 6 months from MR in Danielle Casey.  H/o anemia  Not vegetarian. No heavy menses. Menses monthly.  Has been taking vitamin d    HISTORY:  Past Medical History:  Diagnosis Date  . Migraines    Past Surgical History:  Procedure Laterality Date  . NO PAST SURGERIES     Family History  Problem Relation Age of Onset  . Danielle Casey 40  . Danielle illness Casey   . Danielle Casey   . Danielle Casey 40  . Danielle Casey   . Danielle Casey   . Danielle Casey   . Danielle Casey   . Danielle Casey        unsure of age  . Danielle Casey 43  . Danielle Casey 49  . Clotting disorder Neg Hx   . Heart attack Neg Hx   . Arrhythmia Neg Hx   . Sudden Cardiac Death Neg Hx     Allergies: Patient has no known allergies. Current Outpatient Medications on File Prior to Visit  Medication Sig Dispense Refill  . amphetamine-dextroamphetamine (ADDERALL) 5 MG tablet Take 1 tablet (5 mg total) by mouth 2 (two) times daily. 60 tablet 0  . amphetamine-dextroamphetamine (ADDERALL) 5 MG tablet Take 1  tablet (5 mg total) by mouth 2 (two) times daily. 60 tablet 0  . amphetamine-dextroamphetamine (ADDERALL) 5 MG tablet Take 1 tablet (5 mg total) by mouth 2 (two) times daily. 60 tablet 0  . sertraline (ZOLOFT) 25 MG tablet Take 1 tablet (25 mg total) by mouth daily. 90 tablet 1  . triamcinolone ointment (KENALOG) 0.1 % Apply twice daily to affected areas as needed for rash. Avoid Face, groin and underarms 30 g 2  . VITAMIN D, CHOLECALCIFEROL, PO Take 1 tablet by mouth every other day.     No current facility-administered medications on file prior to visit.    Social History   Tobacco Use  . Smoking status: Former Smoker    Types: E-cigarettes  . Smokeless tobacco: Current User  . Tobacco comment: Juul regularly  Vaping Use  . Vaping Use: Every day  Substance Use Topics  . Alcohol use: Yes    Alcohol/week: 1.0 - 2.0 standard drink    Types: 1 - 2 Standard drinks or equivalent per week    Comment: occasional, once per month  . Drug use: No    Review of Systems  Constitutional: Negative for chills and fever.  Respiratory: Negative for cough.   Cardiovascular: Negative for chest pain and palpitations.  Gastrointestinal: Negative for nausea and vomiting.  Psychiatric/Behavioral: Positive for sleep disturbance. Negative for decreased concentration and suicidal ideas. The patient is nervous/anxious.       Objective:    BP 98/64   Pulse 100   Temp 98.7 F (37.1 C)   Ht 5' (1.524 m)   Wt 99 lb 6.4 oz (45.1 kg)   SpO2 98%   BMI 19.41 kg/m  BP Readings from Last 3 Encounters:  11/04/20 98/64  05/15/20 100/67  04/23/20 118/83   Wt Readings from Last 3 Encounters:  11/04/20 99 lb 6.4 oz (45.1 kg)  05/18/20 95 lb 3.2 oz (43.2 kg)  05/15/20 95 lb (43.1 kg)    Physical Exam Vitals reviewed.  Constitutional:      Appearance: She is well-developed.  Eyes:     Conjunctiva/sclera: Conjunctivae normal.  Cardiovascular:     Rate and Rhythm: Normal rate and regular rhythm.      Pulses: Normal pulses.     Heart sounds: Normal heart sounds.  Pulmonary:     Effort: Pulmonary effort is normal.     Breath sounds: Normal breath sounds. No wheezing, rhonchi or rales.  Skin:    General: Skin is warm and dry.  Neurological:     Danielle Status: She is alert.  Psychiatric:        Speech: Speech normal.        Behavior: Behavior normal.        Thought Content: Thought content normal.        Assessment & Plan:   Problem List Items Addressed This Visit      Other   Anemia   Relevant Orders   CBC with Differential/Platelet   IBC + Ferritin   VITAMIN D 25 Hydroxy (Vit-D Deficiency, Fractures)   Anxiety    Uncontrolled. Suspect zoloft 25mg  inadequate. Patient would prefer to trial stop medication due to apprehension with writing. If no change advised to restart zoloft at 50mg  for better control of anxiety, insomnia. She will let me know how she is doing.      Concentration deficit    Controlled. Weight stable. Upcoming formal testing with behavioral health. Continue adderall 5mg  BID.       Family history of Danielle cancer    MR breasts repeat due Danielle Casey 2022. Plan to do mammogram in November 2022 which is 6 months from MR in Danielle Casey. Patient understands to call me in April and again in October 2022 so I Danielle Casey order imaging.           I am having Danielle Casey maintain her sertraline, (VITAMIN D, CHOLECALCIFEROL, PO), triamcinolone ointment, amphetamine-dextroamphetamine, amphetamine-dextroamphetamine, and amphetamine-dextroamphetamine.   No orders of the defined types were placed in this encounter.   Return precautions given.   Risks, benefits, and alternatives of the medications and treatment plan prescribed today were discussed, and patient expressed understanding.   Education regarding symptom management and diagnosis given to patient on AVS.  Continue to follow with May, FNP for routine health maintenance.   November 2022 and I  agreed with plan.   Danielle Margarita, FNP

## 2020-11-04 NOTE — Assessment & Plan Note (Signed)
MR breasts repeat due May 2022. Plan to do mammogram in November 2022 which is 6 months from MR in May. Patient understands to call me in April and again in October 2022 so I may order imaging.

## 2020-11-04 NOTE — Patient Instructions (Addendum)
Stop zoloft for 3-4 weeks. See if writing symptom get better If no difference, start back on zoloft 50mg .  If writing symptom persists, please let me know and also discuss with psychologist Altabet in February.   Good luck tomorrow!

## 2020-11-04 NOTE — Assessment & Plan Note (Signed)
Uncontrolled. Suspect zoloft 25mg  inadequate. Patient would prefer to trial stop medication due to apprehension with writing. If no change advised to restart zoloft at 50mg  for better control of anxiety, insomnia. She will let me know how she is doing.

## 2020-11-04 NOTE — Assessment & Plan Note (Addendum)
Controlled. Weight stable. Upcoming formal testing with behavioral health. Continue adderall 5mg  BID.

## 2020-11-05 LAB — IBC + FERRITIN
Ferritin: 8.7 ng/mL — ABNORMAL LOW (ref 10.0–291.0)
Iron: 181 ug/dL — ABNORMAL HIGH (ref 42–145)
Saturation Ratios: 36 % (ref 20.0–50.0)
Transferrin: 359 mg/dL (ref 212.0–360.0)

## 2020-11-05 LAB — VITAMIN D 25 HYDROXY (VIT D DEFICIENCY, FRACTURES): VITD: 22.03 ng/mL — ABNORMAL LOW (ref 30.00–100.00)

## 2020-11-05 LAB — CBC WITH DIFFERENTIAL/PLATELET
Basophils Absolute: 0.1 10*3/uL (ref 0.0–0.1)
Basophils Relative: 1.1 % (ref 0.0–3.0)
Eosinophils Absolute: 0.1 10*3/uL (ref 0.0–0.7)
Eosinophils Relative: 1 % (ref 0.0–5.0)
HCT: 40.6 % (ref 36.0–46.0)
Hemoglobin: 13.1 g/dL (ref 12.0–15.0)
Lymphocytes Relative: 31.9 % (ref 12.0–46.0)
Lymphs Abs: 2.1 10*3/uL (ref 0.7–4.0)
MCHC: 32.3 g/dL (ref 30.0–36.0)
MCV: 81.7 fl (ref 78.0–100.0)
Monocytes Absolute: 0.6 10*3/uL (ref 0.1–1.0)
Monocytes Relative: 8.5 % (ref 3.0–12.0)
Neutro Abs: 3.7 10*3/uL (ref 1.4–7.7)
Neutrophils Relative %: 57.5 % (ref 43.0–77.0)
Platelets: 247 10*3/uL (ref 150.0–400.0)
RBC: 4.97 Mil/uL (ref 3.87–5.11)
RDW: 15.5 % (ref 11.5–15.5)
WBC: 6.5 10*3/uL (ref 4.0–10.5)

## 2020-11-09 ENCOUNTER — Other Ambulatory Visit: Payer: Self-pay | Admitting: Family

## 2020-11-09 DIAGNOSIS — E559 Vitamin D deficiency, unspecified: Secondary | ICD-10-CM

## 2020-11-09 MED ORDER — CHOLECALCIFEROL 1.25 MG (50000 UT) PO TABS: ORAL_TABLET | ORAL | 0 refills | Status: DC

## 2020-11-10 ENCOUNTER — Other Ambulatory Visit: Payer: Self-pay | Admitting: Family

## 2020-11-10 DIAGNOSIS — F419 Anxiety disorder, unspecified: Secondary | ICD-10-CM

## 2020-11-14 ENCOUNTER — Encounter: Payer: Self-pay | Admitting: Family

## 2020-11-25 ENCOUNTER — Other Ambulatory Visit: Payer: Self-pay | Admitting: Family

## 2020-11-25 DIAGNOSIS — R41840 Attention and concentration deficit: Secondary | ICD-10-CM

## 2020-11-30 ENCOUNTER — Other Ambulatory Visit: Payer: Self-pay | Admitting: Family

## 2020-11-30 DIAGNOSIS — R41840 Attention and concentration deficit: Secondary | ICD-10-CM

## 2020-11-30 MED ORDER — AMPHETAMINE-DEXTROAMPHETAMINE 5 MG PO TABS
5.0000 mg | ORAL_TABLET | Freq: Two times a day (BID) | ORAL | 0 refills | Status: DC
Start: 2020-11-30 — End: 2021-03-08

## 2020-11-30 MED ORDER — AMPHETAMINE-DEXTROAMPHETAMINE 5 MG PO TABS: 5.0000 mg | ORAL_TABLET | Freq: Two times a day (BID) | ORAL | 0 refills | Status: DC

## 2020-12-02 ENCOUNTER — Ambulatory Visit: Payer: 59 | Admitting: Dermatology

## 2020-12-08 ENCOUNTER — Other Ambulatory Visit: Payer: Self-pay | Admitting: Family

## 2020-12-08 DIAGNOSIS — E559 Vitamin D deficiency, unspecified: Secondary | ICD-10-CM

## 2021-01-09 ENCOUNTER — Other Ambulatory Visit: Payer: Self-pay | Admitting: Family

## 2021-01-09 DIAGNOSIS — R41840 Attention and concentration deficit: Secondary | ICD-10-CM

## 2021-01-11 ENCOUNTER — Other Ambulatory Visit: Payer: Self-pay | Admitting: Family

## 2021-01-11 DIAGNOSIS — R41840 Attention and concentration deficit: Secondary | ICD-10-CM

## 2021-01-11 NOTE — Progress Notes (Signed)
I called and LVM for the patient to call her pharmacy for refills on her Adderall.  Devanie Galanti,cma

## 2021-01-11 NOTE — Progress Notes (Unsigned)
Call pt There are refills on her adderall Please advise her to always call pharmacy first

## 2021-01-25 ENCOUNTER — Ambulatory Visit (INDEPENDENT_AMBULATORY_CARE_PROVIDER_SITE_OTHER): Payer: 59 | Admitting: Psychology

## 2021-01-25 DIAGNOSIS — F4010 Social phobia, unspecified: Secondary | ICD-10-CM | POA: Diagnosis not present

## 2021-02-03 ENCOUNTER — Encounter: Payer: Self-pay | Admitting: Family

## 2021-02-03 ENCOUNTER — Other Ambulatory Visit: Payer: Self-pay

## 2021-02-03 ENCOUNTER — Ambulatory Visit (INDEPENDENT_AMBULATORY_CARE_PROVIDER_SITE_OTHER): Payer: 59 | Admitting: Family

## 2021-02-03 VITALS — BP 102/64 | HR 89 | Temp 97.8°F | Ht 60.0 in | Wt 105.6 lb

## 2021-02-03 DIAGNOSIS — R300 Dysuria: Secondary | ICD-10-CM

## 2021-02-03 DIAGNOSIS — F419 Anxiety disorder, unspecified: Secondary | ICD-10-CM

## 2021-02-03 DIAGNOSIS — R319 Hematuria, unspecified: Secondary | ICD-10-CM | POA: Diagnosis not present

## 2021-02-03 DIAGNOSIS — R198 Other specified symptoms and signs involving the digestive system and abdomen: Secondary | ICD-10-CM

## 2021-02-03 DIAGNOSIS — R41840 Attention and concentration deficit: Secondary | ICD-10-CM | POA: Diagnosis not present

## 2021-02-03 NOTE — Patient Instructions (Addendum)
Please call me in April so I may order MR breasts and again in October 2022 so I may order mammogram.   Trial of FODMAP diet.   Plenty of water while we await urine culture results; call me in 2 days if you have not heard from me with results    Low-FODMAP Eating Plan  FODMAP stands for fermentable oligosaccharides, disaccharides, monosaccharides, and polyols. These are sugars that are hard for some people to digest. A low-FODMAP eating plan may help some people who have irritable bowel syndrome (IBS) and certain other bowel (intestinal) diseases to manage their symptoms. This meal plan can be complicated to follow. Work with a diet and nutrition specialist (dietitian) to make a low-FODMAP eating plan that is right for you. A dietitian can help make sure that you get enough nutrition from this diet. What are tips for following this plan? Reading food labels  Check labels for hidden FODMAPs such as: ? High-fructose syrup. ? Honey. ? Agave. ? Natural fruit flavors. ? Onion or garlic powder.  Choose low-FODMAP foods that contain 3-4 grams of fiber per serving.  Check food labels for serving sizes. Eat only one serving at a time to make sure FODMAP levels stay low. Shopping  Shop with a list of foods that are recommended on this diet and make a meal plan. Meal planning  Follow a low-FODMAP eating plan for up to 6 weeks, or as told by your health care provider or dietitian.  To follow the eating plan: 1. Eliminate high-FODMAP foods from your diet completely. Choose only low-FODMAP foods to eat. You will do this for 2-6 weeks. 2. Gradually reintroduce high-FODMAP foods into your diet one at a time. Most people should wait a few days before introducing the next new high-FODMAP food into their meal plan. Your dietitian can recommend how quickly you may reintroduce foods. 3. Keep a daily record of what and how much you eat and drink. Make note of any symptoms that you have after  eating. 4. Review your daily record with a dietitian regularly to identify which foods you can eat and which foods you should avoid. General tips  Drink enough fluid each day to keep your urine pale yellow.  Avoid processed foods. These often have added sugar and may be high in FODMAPs.  Avoid most dairy products, whole grains, and sweeteners.  Work with a dietitian to make sure you get enough fiber in your diet.  Avoid high FODMAP foods at meals to manage symptoms. Recommended foods Fruits Bananas, oranges, tangerines, lemons, limes, blueberries, raspberries, strawberries, grapes, cantaloupe, honeydew melon, kiwi, papaya, passion fruit, and pineapple. Limited amounts of dried cranberries, banana chips, and shredded coconut. Vegetables Eggplant, zucchini, cucumber, peppers, green beans, bean sprouts, lettuce, arugula, kale, Swiss chard, spinach, collard greens, bok choy, summer squash, potato, and tomato. Limited amounts of corn, carrot, and sweet potato. Green parts of scallions. Grains Gluten-free grains, such as rice, oats, buckwheat, quinoa, corn, polenta, and millet. Gluten-free pasta, bread, or cereal. Rice noodles. Corn tortillas. Meats and other proteins Unseasoned beef, pork, poultry, or fish. Eggs. Tomasa Blase. Tofu (firm) and tempeh. Limited amounts of nuts and seeds, such as almonds, walnuts, [place] nuts, pecans, peanuts, nut butters, pumpkin seeds, chia seeds, and sunflower seeds. Dairy Lactose-free milk, yogurt, and kefir. Lactose-free cottage cheese and ice cream. Non-dairy milks, such as almond, coconut, hemp, and rice milk. Non-dairy yogurt. Limited amounts of goat cheese, brie, mozzarella, parmesan, swiss, and other hard cheeses. Fats and oils Butter-free spreads.  Vegetable oils, such as olive, canola, and sunflower oil. Seasoning and other foods Artificial sweeteners with names that do not end in "ol," such as aspartame, saccharine, and stevia. Maple syrup, white table sugar,  raw sugar, brown sugar, and molasses. Mayonnaise, soy sauce, and tamari. Fresh basil, coriander, parsley, rosemary, and thyme. Beverages Water and mineral water. Sugar-sweetened soft drinks. Small amounts of orange juice or cranberry juice. Black and green tea. Most dry wines. Coffee. The items listed above may not be a complete list of foods and beverages you can eat. Contact a dietitian for more information. Foods to avoid Fruits Fresh, dried, and juiced forms of apple, pear, watermelon, peach, plum, cherries, apricots, blackberries, boysenberries, figs, nectarines, and mango. Avocado. Vegetables Chicory root, artichoke, asparagus, cabbage, snow peas, Brussels sprouts, broccoli, sugar snap peas, mushrooms, celery, and cauliflower. Onions, garlic, leeks, and the white part of scallions. Grains Wheat, including kamut, durum, and semolina. Barley and bulgur. Couscous. Wheat-based cereals. Wheat noodles, bread, crackers, and pastries. Meats and other proteins Fried or fatty meat. Sausage. Cashews and pistachios. Soybeans, baked beans, black beans, chickpeas, kidney beans, fava beans, navy beans, lentils, black-eyed peas, and split peas. Dairy Milk, yogurt, ice cream, and soft cheese. Cream and sour cream. Milk-based sauces. Custard. Buttermilk. Soy milk. Seasoning and other foods Any sugar-free gum or candy. Foods that contain artificial sweeteners such as sorbitol, mannitol, isomalt, or xylitol. Foods that contain honey, high-fructose corn syrup, or agave. Bouillon, vegetable stock, beef stock, and chicken stock. Garlic and onion powder. Condiments made with onion, such as hummus, chutney, pickles, relish, salad dressing, and salsa. Tomato paste. Beverages Chicory-based drinks. Coffee substitutes. Chamomile tea. Fennel tea. Sweet or fortified wines such as port or sherry. Diet soft drinks made with isomalt, mannitol, maltitol, sorbitol, or xylitol. Apple, pear, and mango juice. Juices with  high-fructose corn syrup. The items listed above may not be a complete list of foods and beverages you should avoid. Contact a dietitian for more information. Summary  FODMAP stands for fermentable oligosaccharides, disaccharides, monosaccharides, and polyols. These are sugars that are hard for some people to digest.  A low-FODMAP eating plan is a short-term diet that helps to ease symptoms of certain bowel diseases.  The eating plan usually lasts up to 6 weeks. After that, high-FODMAP foods are reintroduced gradually and one at a time. This can help you find out which foods may be causing symptoms.  A low-FODMAP eating plan can be complicated. It is best to work with a dietitian who has experience with this type of plan. This information is not intended to replace advice given to you by your health care provider. Make sure you discuss any questions you have with your health care provider. Document Revised: 04/02/2020 Document Reviewed: 04/02/2020 Elsevier Patient Education  2021 [employer].

## 2021-02-03 NOTE — Assessment & Plan Note (Addendum)
Chronic. Long discussion as it relates to consideration of lactose intolerance, artificial sweeteners, or IBS. Advised limit or completely eliminate lactose and follow FODMAP diet. Negative celiac screen done by outside genetic group. We will follow

## 2021-02-03 NOTE — Assessment & Plan Note (Signed)
Stable. Advised reasonable to monitor as her preference despite continued apprehension with writing is not to resume zoloft. We will continue to discuss.

## 2021-02-03 NOTE — Assessment & Plan Note (Signed)
Well appearing . Afebrile. Pending urine studies and patient comfortable with waiting on urine culture prior to treatment.

## 2021-02-03 NOTE — Progress Notes (Signed)
Subjective:    Patient ID: Danielle Casey, female    DOB: [birth year], 30 y.o.   MRN: [id number]  CC: Danielle Casey is a 30 y.o. female who presents today for follow up.   HPI: She complains of dysuria,  Hematuria, urinary frequency x 7 days, waxed and waned.    No fever, flank pain, nausea.   LMP 3- 4 weeks ago. No concern for pregnancy.  She continues to have apprehension when she is writing and notices that when more anxious primarily at work or when working on schoolwork , she notices symptom is worse. She stopped taking zoloft and doesn't think she needs for anxiety nor apprehension. She prefers to monitor symptoms   Complains of years of alternating constipation to loose or watery brown stools. Weight is stable.  No blood in stool, abdominal bloating.She uses artificial sweetener.  As a child diagnosed with lactose intolerance. She is not avoiding lactose and thinks likely inaccurate diagnosis. No pattern as it relates to diet. Has had negative celiac screen.   ADHD- feels well on current adderall dose. Weight is stable.       HISTORY:  Past Medical History:  Diagnosis Date  . Migraines    Past Surgical History:  Procedure Laterality Date  . NO PAST SURGERIES     Family History  Problem Relation Age of Onset  . Breast cancer Mother 11  . Mental illness Mother   . Cervical cancer Mother   . Breast cancer Maternal Grandmother 40  . Arthritis Maternal Grandmother   . Hypertension Maternal Grandmother   . Diabetes Maternal Grandmother   . Mental illness Maternal Grandmother   . Colon cancer Maternal Uncle        unsure of age  . Thyroid cancer Maternal Uncle 45  . Cervical cancer Sister 88  . Clotting disorder Neg Hx   . Heart attack Neg Hx   . Arrhythmia Neg Hx   . Sudden Cardiac Death Neg Hx     Allergies: Patient has no known allergies. Current Outpatient Medications on File Prior to Visit  Medication Sig Dispense Refill  .  amphetamine-dextroamphetamine (ADDERALL) 5 MG tablet Take 1 tablet (5 mg total) by mouth 2 (two) times daily. 60 tablet 0  . amphetamine-dextroamphetamine (ADDERALL) 5 MG tablet Take 1 tablet (5 mg total) by mouth 2 (two) times daily. 60 tablet 0  . amphetamine-dextroamphetamine (ADDERALL) 5 MG tablet Take 1 tablet (5 mg total) by mouth 2 (two) times daily. 60 tablet 0  . Cholecalciferol 1.25 MG (50000 UT) TABS 50,000 units PO qwk for 8 weeks. 8 tablet 0  . triamcinolone ointment (KENALOG) 0.1 % Apply twice daily to affected areas as needed for rash. Avoid Face, groin and underarms 30 g 2   No current facility-administered medications on file prior to visit.    Social History   Tobacco Use  . Smoking status: Former Smoker    Types: E-cigarettes  . Smokeless tobacco: Current User  . Tobacco comment: Juul regularly  Vaping Use  . Vaping Use: Every day  Substance Use Topics  . Alcohol use: Yes    Alcohol/week: 1.0 - 2.0 standard drink    Types: 1 - 2 Standard drinks or equivalent per week    Comment: occasional, once per month  . Drug use: No    Review of Systems  Constitutional: Negative for chills and fever.  Respiratory: Negative for cough.   Cardiovascular: Negative for chest pain and palpitations.  Gastrointestinal: Negative  for abdominal distention, abdominal pain, anal bleeding, blood in stool, nausea and vomiting.  Genitourinary: Positive for dysuria and hematuria. Negative for difficulty urinating.  Psychiatric/Behavioral: Negative for sleep disturbance and suicidal ideas. The patient is nervous/anxious.       Objective:    BP 102/64   Pulse 89   Temp 97.8 F (36.6 C)   Ht 5' (1.524 m)   Wt 105 lb 9.6 oz (47.9 kg)   SpO2 99%   BMI 20.62 kg/m  BP Readings from Last 3 Encounters:  02/03/21 102/64  11/04/20 98/64  05/15/20 100/67   Wt Readings from Last 3 Encounters:  02/03/21 105 lb 9.6 oz (47.9 kg)  11/04/20 99 lb 6.4 oz (45.1 kg)  05/18/20 95 lb 3.2 oz  (43.2 kg)    Physical Exam Vitals reviewed.  Constitutional:      Appearance: She is well-developed and well-nourished.  Eyes:     Conjunctiva/sclera: Conjunctivae normal.  Cardiovascular:     Rate and Rhythm: Normal rate and regular rhythm.     Pulses: Normal pulses.     Heart sounds: Normal heart sounds.  Pulmonary:     Effort: Pulmonary effort is normal.     Breath sounds: Normal breath sounds. No wheezing, rhonchi or rales.  Skin:    General: Skin is warm and dry.  Neurological:     Mental Status: She is alert.  Psychiatric:        Mood and Affect: Mood and affect normal.        Speech: Speech normal.        Behavior: Behavior normal.        Thought Content: Thought content normal.        Assessment & Plan:   Problem List Items Addressed This Visit      Other   Alternating constipation and diarrhea    Chronic. Long discussion as it relates to consideration of lactose intolerance, artificial sweeteners, or IBS. Advised limit or completely eliminate lactose and follow FODMAP diet. Negative celiac screen done by outside genetic group. We will follow      Anxiety    Stable. Advised reasonable to monitor as her preference despite continued apprehension with writing is not to resume zoloft. We will continue to discuss.       Concentration deficit    Pending formal diagnosis with psychology . Will continue adderal 5mg  bid.       Dysuria    Well appearing . Afebrile. Pending urine studies and patient comfortable with waiting on urine culture prior to treatment.        Other Visit Diagnoses    Hematuria, unspecified type    -  Primary   Relevant Orders   Urine Culture   Urinalysis, Routine w reflex microscopic       I have discontinued [name]'s sertraline. I am also having her maintain her triamcinolone ointment, Cholecalciferol, amphetamine-dextroamphetamine, amphetamine-dextroamphetamine, and amphetamine-dextroamphetamine.   No orders of the  defined types were placed in this encounter.   Return precautions given.   Risks, benefits, and alternatives of the medications and treatment plan prescribed today were discussed, and patient expressed understanding.   Education regarding symptom management and diagnosis given to patient on AVS.  Continue to follow with 01-25-1979, FNP for routine health maintenance.   [name] and I agreed with plan.   Danielle Margarita, FNP

## 2021-02-03 NOTE — Assessment & Plan Note (Signed)
Pending formal diagnosis with psychology . Will continue adderal 5mg  bid.

## 2021-02-04 LAB — URINALYSIS, ROUTINE W REFLEX MICROSCOPIC
Bilirubin Urine: NEGATIVE
Ketones, ur: NEGATIVE
Leukocytes,Ua: NEGATIVE
Nitrite: NEGATIVE
Specific Gravity, Urine: >=1.03 — AB (ref 1.000–1.030)
Total Protein, Urine: NEGATIVE
Urine Glucose: NEGATIVE
Urobilinogen, UA: 0.2 (ref 0.0–1.0)
pH: 6 (ref 5.0–8.0)

## 2021-02-04 LAB — URINE CULTURE
MICRO NUMBER:: 11626925
Result:: NO GROWTH
SPECIMEN QUALITY:: ADEQUATE

## 2021-02-05 ENCOUNTER — Encounter: Payer: Self-pay | Admitting: Family

## 2021-02-15 ENCOUNTER — Ambulatory Visit: Payer: 59 | Admitting: Psychology

## 2021-02-16 DIAGNOSIS — F439 Reaction to severe stress, unspecified: Secondary | ICD-10-CM

## 2021-02-16 DIAGNOSIS — F900 Attention-deficit hyperactivity disorder, predominantly inattentive type: Secondary | ICD-10-CM

## 2021-03-05 ENCOUNTER — Other Ambulatory Visit: Payer: Self-pay | Admitting: Family

## 2021-03-05 DIAGNOSIS — R41840 Attention and concentration deficit: Secondary | ICD-10-CM

## 2021-03-08 ENCOUNTER — Other Ambulatory Visit: Payer: Self-pay | Admitting: Family

## 2021-03-08 DIAGNOSIS — R41840 Attention and concentration deficit: Secondary | ICD-10-CM

## 2021-03-08 MED ORDER — AMPHETAMINE-DEXTROAMPHETAMINE 5 MG PO TABS
5.0000 mg | ORAL_TABLET | Freq: Two times a day (BID) | ORAL | 0 refills | Status: DC
Start: 2021-03-08 — End: 2021-06-21

## 2021-03-08 MED ORDER — AMPHETAMINE-DEXTROAMPHETAMINE 5 MG PO TABS: 5.0000 mg | ORAL_TABLET | Freq: Two times a day (BID) | ORAL | 0 refills | Status: DC

## 2021-03-09 ENCOUNTER — Telehealth: Payer: Self-pay | Admitting: Family

## 2021-03-09 DIAGNOSIS — F909 Attention-deficit hyperactivity disorder, unspecified type: Secondary | ICD-10-CM

## 2021-03-09 NOTE — Telephone Encounter (Signed)
Call pt  rec'ed formal diagnosis of ADHD from [facility] phD,  He mentioned counseling referral as way to manage emotional reactivity and stress Would she like to discuss at follow up or like a counseling referral beforehand?

## 2021-03-09 NOTE — Telephone Encounter (Signed)
Unable to reach patient due to going straight to VM then VM was full.

## 2021-03-25 NOTE — Telephone Encounter (Signed)
Mychart message sent to patient.

## 2021-04-23 ENCOUNTER — Other Ambulatory Visit: Payer: Self-pay | Admitting: Family

## 2021-04-23 DIAGNOSIS — R41840 Attention and concentration deficit: Secondary | ICD-10-CM

## 2021-04-23 NOTE — Telephone Encounter (Signed)
RX Refill:adderall Last Seen:02-03-21 Last ordered:03-08-21

## 2021-04-27 ENCOUNTER — Encounter: Payer: Self-pay | Admitting: Family

## 2021-05-14 ENCOUNTER — Encounter: Payer: Self-pay | Admitting: Family

## 2021-05-14 ENCOUNTER — Other Ambulatory Visit (INDEPENDENT_AMBULATORY_CARE_PROVIDER_SITE_OTHER): Payer: 59

## 2021-05-14 ENCOUNTER — Telehealth (INDEPENDENT_AMBULATORY_CARE_PROVIDER_SITE_OTHER): Payer: 59 | Admitting: Family

## 2021-05-14 VITALS — Ht 60.0 in

## 2021-05-14 DIAGNOSIS — J029 Acute pharyngitis, unspecified: Secondary | ICD-10-CM

## 2021-05-14 DIAGNOSIS — U071 COVID-19: Secondary | ICD-10-CM | POA: Diagnosis not present

## 2021-05-14 LAB — POCT RAPID STREP A (OFFICE): Rapid Strep A Screen: NEGATIVE

## 2021-05-14 MED ORDER — AMOXICILLIN-POT CLAVULANATE 875-125 MG PO TABS: 1.0000 | ORAL_TABLET | Freq: Two times a day (BID) | ORAL | 0 refills | Status: DC

## 2021-05-14 NOTE — Patient Instructions (Addendum)
Start augmentin if symptoms fail to improve.   If you develop fever, trouble swallowing.  Ensure to take probiotics while on antibiotics and also for 2 weeks after completion. This can either be by eating yogurt daily or taking a probiotic supplement over the counter such as Culturelle.It is important to re-colonize the gut with good bacteria and also to prevent any diarrheal infections associated with antibiotic use.   Sore Throat A sore throat is pain, burning, irritation, or scratchiness in the throat. When you have a sore throat, you may feel pain or tenderness in your throat when youswallow or talk. Many things can cause a sore throat, including: An infection. Seasonal allergies. Dryness in the air. Irritants, such as smoke or pollution. Radiation treatment to the area. Gastroesophageal reflux disease (GERD). A tumor. A sore throat is often the first sign of another sickness. It may happen with other symptoms, such as coughing, sneezing, fever, and swollen neck glands.Most sore throats go away without medical treatment. Follow these instructions at home:     Take over-the-counter medicines only as told by your health care provider. If your child has a sore throat, do not give your child aspirin because of the association with Reye syndrome. Drink enough fluids to keep your urine pale yellow. Rest as needed. To help with pain, try: Sipping warm liquids, such as broth, herbal tea, or warm water. Eating or drinking cold or frozen liquids, such as frozen ice pops. Gargling with a salt-water mixture 3-4 times a day or as needed. To make a salt-water mixture, completely dissolve -1 tsp (3-6 g) of salt in 1 cup (237 mL) of warm water. Sucking on hard candy or throat lozenges. Putting a cool-mist humidifier in your bedroom at night to moisten the air. Sitting in the bathroom with the door closed for 5-10 minutes while you run hot water in the shower. Do not use any products that contain  nicotine or tobacco, such as cigarettes, e-cigarettes, and chewing tobacco. If you need help quitting, ask your health care provider. Wash your hands well and often with soap and water. If soap and water are not available, use hand sanitizer. Contact a health care provider if: You have a fever for more than 2-3 days. You have symptoms that last (are persistent) for more than 2-3 days. Your throat does not get better within 7 days. You have a fever and your symptoms suddenly get worse. Your child who is 3 months to 82 years old has a temperature of 102.42F (39C) or higher. Get help right away if: You have difficulty breathing. You cannot swallow fluids, soft foods, or your saliva. You have increased swelling in your throat or neck. You have persistent nausea and vomiting. Summary A sore throat is pain, burning, irritation, or scratchiness in the throat. Many things can cause a sore throat. Take over-the-counter medicines only as told by your health care provider. Do not give your child aspirin. Drink plenty of fluids, and rest as needed. Contact a health care provider if your symptoms worsen or your sore throat does not get better within 7 days. This information is not intended to replace advice given to you by your health care provider. Make sure you discuss any questions you have with your healthcare provider. Document Revised: 04/16/2018 Document Reviewed: 04/16/2018 Elsevier Patient Education  2022 [employer].

## 2021-05-14 NOTE — Progress Notes (Deleted)
La

## 2021-05-14 NOTE — Progress Notes (Signed)
Virtual Visit via Video Note  I connected with@  on 05/16/21 at  2:00 PM EDT by a video enabled telemedicine application and verified that I am speaking with the correct person using two identifiers.  Location patient: home Location provider:work  Persons participating in the virtual visit: patient, provider  I discussed the limitations of evaluation and management by telemedicine and the availability of in person appointments. The patient expressed understanding and agreed to proceed.  Interactive audio and video telecommunications were attempted between this provider and patient, however failed, due to patient having technical difficulties or patient did not have access to video capability.  We continued and completed visit with audio only.    HPI:  Sore throat started 4 days ago, unchanged.  similar to how she felt yesterday.   At that time chills, since resolved.   Describes white patches back of throat.   No trouble swallowing. She is drooling.   seen at urgent care 3 days ago as she thought it was covid and tested for covid, negative antigen test.   No cough, fever, congestion, sinus pain, facial swelling.   Antibiotics for wisdom teeth and prescribed amoxicillin 04/06/21.   She hasnt taken any other the counter medications for this.   Anxiety- controlled at this time. She declines counseling referral for managing stress, anxiety.    ROS: See pertinent positives and negatives per HPI.    EXAM:  VITALS per patient if applicable: Ht 5' (1.524 m)   BMI 20.62 kg/m  BP Readings from Last 3 Encounters:  02/03/21 102/64  11/04/20 98/64  05/15/20 100/67   Wt Readings from Last 3 Encounters:  02/03/21 105 lb 9.6 oz (47.9 kg)  11/04/20 99 lb 6.4 oz (45.1 kg)  05/18/20 95 lb 3.2 oz (43.2 kg)     ASSESSMENT AND PLAN:  Discussed the following assessment and plan:  Problem List Items Addressed This Visit       Other   COVID-19    PCR positive for covid. Strep  negative. Mild symptoms. No acute respiratory distress. She is vaccinated without booster for covid. Sore throat is most bothersome and counseled on salt water gargle. I had sent in augmentin prior to knowing patient had covid.Sent information regarding quarantine, symptoms which warrant further evaluation, and vitmains via  Mychart.  Patient will let me know how she is doing.       Other Visit Diagnoses     Sore throat    -  Primary   Relevant Medications   amoxicillin-clavulanate (AUGMENTIN) 875-125 MG tablet   Other Relevant Orders   POCT rapid strep A (Completed)   Novel Coronavirus, NAA ([facility]) (Completed)       -we discussed possible serious and likely etiologies, options for evaluation and workup, limitations of telemedicine visit vs in person visit, treatment, treatment risks and precautions. Pt prefers to treat via telemedicine empirically rather then risking or undertaking an in person visit at this moment.  .   I discussed the assessment and treatment plan with the patient. The patient was provided an opportunity to ask questions and all were answered. The patient agreed with the plan and demonstrated an understanding of the instructions.   The patient was advised to call back or seek an in-person evaluation if the symptoms worsen or if the condition fails to improve as anticipated.  I have spent 12 minutes with a patient including discussing symptoms, and discussion plan of care.       [name], FNP

## 2021-05-16 DIAGNOSIS — U071 COVID-19: Secondary | ICD-10-CM | POA: Insufficient documentation

## 2021-05-16 LAB — NOVEL CORONAVIRUS, NAA: SARS-CoV-2, NAA: DETECTED — AB

## 2021-05-16 LAB — SARS-COV-2, NAA 2 DAY TAT

## 2021-05-16 NOTE — Assessment & Plan Note (Addendum)
PCR positive for covid. Strep negative. Mild symptoms. No acute respiratory distress. She is vaccinated without booster for covid. Sore throat is most bothersome and counseled on salt water gargle. I had sent in augmentin prior to knowing patient had covid.Sent information regarding quarantine, symptoms which warrant further evaluation, and vitmains via  Mychart.  Patient will let me know how she is doing.

## 2021-05-24 ENCOUNTER — Ambulatory Visit: Payer: 59 | Admitting: Family

## 2021-06-21 ENCOUNTER — Other Ambulatory Visit: Payer: Self-pay | Admitting: Family

## 2021-06-21 DIAGNOSIS — R41840 Attention and concentration deficit: Secondary | ICD-10-CM

## 2021-06-21 MED ORDER — AMPHETAMINE-DEXTROAMPHETAMINE 5 MG PO TABS: 5.0000 mg | ORAL_TABLET | Freq: Two times a day (BID) | ORAL | 0 refills | Status: DC

## 2021-08-04 ENCOUNTER — Other Ambulatory Visit: Payer: Self-pay | Admitting: Family

## 2021-08-04 DIAGNOSIS — R41840 Attention and concentration deficit: Secondary | ICD-10-CM

## 2021-08-06 ENCOUNTER — Other Ambulatory Visit: Payer: Self-pay | Admitting: Family

## 2021-08-06 ENCOUNTER — Telehealth: Payer: Self-pay | Admitting: Family

## 2021-08-06 DIAGNOSIS — R41840 Attention and concentration deficit: Secondary | ICD-10-CM

## 2021-08-06 NOTE — Telephone Encounter (Signed)
I have spoken to patient as well as pharmacy. Pt is aware that [facility] os filling for her.

## 2021-08-06 NOTE — Telephone Encounter (Signed)
Call pt Please advise that she called her pharmacy prior to selecting Adderall refill.  There is a refill there for September.

## 2021-08-17 ENCOUNTER — Encounter: Payer: Self-pay | Admitting: Family

## 2021-08-17 ENCOUNTER — Other Ambulatory Visit: Payer: Self-pay

## 2021-08-17 ENCOUNTER — Ambulatory Visit (INDEPENDENT_AMBULATORY_CARE_PROVIDER_SITE_OTHER): Payer: Self-pay | Admitting: Family

## 2021-08-17 VITALS — BP 102/68 | HR 92 | Temp 99.1°F | Ht 60.0 in | Wt 103.4 lb

## 2021-08-17 DIAGNOSIS — F909 Attention-deficit hyperactivity disorder, unspecified type: Secondary | ICD-10-CM

## 2021-08-17 DIAGNOSIS — F419 Anxiety disorder, unspecified: Secondary | ICD-10-CM

## 2021-08-17 NOTE — Patient Instructions (Addendum)
Referral to counseling  Let us know if you dont hear back within a week in regards to an appointment being scheduled.   Please let me know how you doing  Our hope is for gradual improvement of mood since starting medication; however this may take several weeks.   If you start to have unusual thoughts, thoughts of hurting yourself, or anyone else, please go immediately to the emergency department.   Follow up in 6 weeks.   Please text to [phone] and write the word 'home'. This will put you in touch with trained crisis counselor and resources.    National Suicide Prevention Hotline - available 24 hours a day, 7 days a week.  2488653375  Major Depressive Disorder Major depressive disorder is a mental illness. It also may be called clinical depression or unipolar depression. Major depressive disorder usually causes feelings of sadness, hopelessness, or helplessness. Some people with this disorder do not feel particularly sad but lose interest in doing things they used to enjoy (anhedonia). Major depressive disorder also can cause physical symptoms. It can interfere with work, school, relationships, and other normal everyday activities. The disorder varies in severity but is longer lasting and more serious than the sadness we all feel from time to time in our lives. Major depressive disorder often is triggered by stressful life events or major life changes. Examples of these triggers include divorce, loss of your job or home, a move, and the death of a family member or close friend. Sometimes this disorder occurs for no obvious reason at all. People who have family members with major depressive disorder or bipolar disorder are at higher risk for developing this disorder, with or without life stressors. Major depressive disorder can occur at any age. It may occur just once in your life (single episode major depressive disorder). It may occur multiple times (recurrent major depressive  disorder). SYMPTOMS People with major depressive disorder have either anhedonia or depressed mood on nearly a daily basis for at least 2 weeks or longer. Symptoms of depressed mood include: Feelings of sadness (blue or down in the dumps) or emptiness. Feelings of hopelessness or helplessness. Tearfulness or episodes of crying (may be observed by others). Irritability (children and adolescents). In addition to depressed mood or anhedonia or both, people with this disorder have at least four of the following symptoms: Difficulty sleeping or sleeping too much.   Significant change (increase or decrease) in appetite or weight.   Lack of energy or motivation. Feelings of guilt and worthlessness.   Difficulty concentrating, remembering, or making decisions. Unusually slow movement (psychomotor retardation) or restlessness (as observed by others).   Recurrent wishes for death, recurrent thoughts of self-harm (suicide), or a suicide attempt. People with major depressive disorder commonly have persistent negative thoughts about themselves, other people, and the world. People with severe major depressive disorder may experience distorted beliefs or perceptions about the world (psychotic delusions). They also may see or hear things that are not real (psychotic hallucinations). DIAGNOSIS Major depressive disorder is diagnosed through an assessment by your health care provider. Your health care provider will ask about aspects of your daily life, such as mood, sleep, and appetite, to see if you have the diagnostic symptoms of major depressive disorder. Your health care provider may ask about your medical history and use of alcohol or drugs, including prescription medicines. Your health care provider also may do a physical exam and blood work. This is because certain medical conditions and the use of  certain substances can cause major depressive disorder-like symptoms (secondary depression). Your health care  provider also may refer you to a mental health specialist for further evaluation and treatment. TREATMENT It is important to recognize the symptoms of major depressive disorder and seek treatment. The following treatments can be prescribed for this disorder:   Medicine. Antidepressant medicines usually are prescribed. Antidepressant medicines are thought to correct chemical imbalances in the brain that are commonly associated with major depressive disorder. Other types of medicine may be added if the symptoms do not respond to antidepressant medicines alone or if psychotic delusions or hallucinations occur. Talk therapy. Talk therapy can be helpful in treating major depressive disorder by providing support, education, and guidance. Certain types of talk therapy also can help with negative thinking (cognitive behavioral therapy) and with relationship issues that trigger this disorder (interpersonal therapy). A mental health specialist can help determine which treatment is best for you. Most people with major depressive disorder do well with a combination of medicine and talk therapy. Treatments involving electrical stimulation of the brain can be used in situations with extremely severe symptoms or when medicine and talk therapy do not work over time. These treatments include electroconvulsive therapy, transcranial magnetic stimulation, and vagal nerve stimulation.   This information is not intended to replace advice given to you by your health care provider. Make sure you discuss any questions you have with your health care provider.   Document Released: 03/11/2013 Document Revised: 12/05/2014 Document Reviewed: 03/11/2013 Elsevier Interactive Patient Education [employer]

## 2021-08-17 NOTE — Progress Notes (Signed)
Subjective:    Patient ID: Danielle Casey, female    DOB: 15-May-1991, 30 y.o.   MRN: [id number]  CC: IOLANI TWILLEY is a 30 y.o. female who presents today for follow up.   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  She is frequently staying awake for 24 hours.  She works third shift.  She graduates in December 2022. She is feeling overwhelmed.  These also is exacerbating depression, anxiety.  She is feeling more tearful.  She is forgetting to do certain things , being late to work or late returning assignments.  In the past she has felt like her coping mechanisms were excellent however she feels this is too much and she would like to reset.  She would like to consider leave of absence and ultimately looking for a job locally so she does not have to drive 1 hour each way to [facility].     Mother and boyfriend are very supportive of her.  No thoughts of suicide ideation or homicidal ideation.  She denies any suicide plan.  No prior history of suicide attempt  She remains compliant with Adderall 5 mg twice a day although reports has not very effective at this time.  Her concentration is poor.  She does not want to increase medication at this time.  She is drinking 5-hour energy drinks daily to stay awake   HISTORY:  Past Medical History:  Diagnosis Date   Migraines    Past Surgical History:  Procedure Laterality Date   NO PAST SURGERIES     Family History  Problem Relation Age of Onset   Breast cancer Mother 77   Mental illness Mother    Cervical cancer Mother    Breast cancer Maternal Grandmother 13   Arthritis Maternal Grandmother    Hypertension Maternal Grandmother    Diabetes Maternal Grandmother    Mental illness Maternal Grandmother    Colon cancer Maternal Uncle        unsure of age   Thyroid cancer Maternal Uncle 45   Cervical cancer Sister 75   Clotting disorder Neg Hx    Heart attack Neg Hx    Arrhythmia Neg Hx    Sudden Cardiac Death Neg  Hx     Allergies: Patient has no known allergies. Current Outpatient Medications on File Prior to Visit  Medication Sig Dispense Refill   amphetamine-dextroamphetamine (ADDERALL) 5 MG tablet Take 1 tablet (5 mg total) by mouth 2 (two) times daily. 60 tablet 0   amphetamine-dextroamphetamine (ADDERALL) 5 MG tablet Take 1 tablet (5 mg total) by mouth 2 (two) times daily. 60 tablet 0   amphetamine-dextroamphetamine (ADDERALL) 5 MG tablet Take 1 tablet (5 mg total) by mouth 2 (two) times daily. 60 tablet 0   Cholecalciferol 1.25 MG (50000 UT) TABS 50,000 units PO qwk for 8 weeks. 8 tablet 0   triamcinolone ointment (KENALOG) 0.1 % Apply twice daily to affected areas as needed for rash. Avoid Face, groin and underarms 30 g 2   No current facility-administered medications on file prior to visit.    Social History   Tobacco Use   Smoking status: Former    Types: E-cigarettes   Smokeless tobacco: Current   Tobacco comments:    Juul regularly  Vaping Use   Vaping Use: Every day  Substance Use Topics   Alcohol use: Yes    Alcohol/week: 1.0 - 2.0 standard drink    Types: 1 - 2  Standard drinks or equivalent per week    Comment: occasional, once per month   Drug use: No    Review of Systems  Constitutional:  Positive for fatigue. Negative for chills and fever.  Respiratory:  Negative for cough.   Cardiovascular:  Negative for chest pain and palpitations.  Gastrointestinal:  Negative for nausea and vomiting.  Psychiatric/Behavioral:  Positive for decreased concentration and sleep disturbance. Negative for suicidal ideas. The patient is nervous/anxious.      Objective:    BP 102/68 (BP Location: Left Arm, Patient Position: Sitting, Cuff Size: Normal)   Pulse 92   Temp 99.1 F (37.3 C) (Oral)   Ht 5' (1.524 m)   Wt 103 lb 6.4 oz (46.9 kg)   SpO2 99%   BMI 20.19 kg/m  BP Readings from Last 3 Encounters:  08/17/21 102/68  02/03/21 102/64  11/04/20 98/64   Wt Readings from Last 3  Encounters:  08/17/21 103 lb 6.4 oz (46.9 kg)  02/03/21 105 lb 9.6 oz (47.9 kg)  11/04/20 99 lb 6.4 oz (45.1 kg)    Physical Exam Vitals reviewed.  Constitutional:      Appearance: She is well-developed.  Eyes:     Conjunctiva/sclera: Conjunctivae normal.  Cardiovascular:     Rate and Rhythm: Normal rate and regular rhythm.     Pulses: Normal pulses.     Heart sounds: Normal heart sounds.  Pulmonary:     Effort: Pulmonary effort is normal.     Breath sounds: Normal breath sounds. No wheezing, rhonchi or rales.  Skin:    General: Skin is warm and dry.  Neurological:     Mental Status: She is alert.  Psychiatric:        Speech: Speech normal.        Behavior: Behavior normal.        Thought Content: Thought content normal.     Comments: tearful       Assessment & Plan:   Problem List Items Addressed This Visit       Other   ADHD    Suboptimal at this time.  Long discussion as it relates to chronic sleep deprivation and concentration.  I agreed that we should not increase Adderall 5 mg at this time as I am concerned anxiety, insomnia would worsen.  We will continue Adderall 5 mg twice daily at this time.  Weight is stable.      Anxiety and depression - Primary    Uncontrolled.  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   This is not optimal and I think it is led to her feeling overwhelmed, exacerbating anxiety depression.  We discussed coping mechanisms including sleep hygiene, exercise, eating healthy, and simply time for herself.  She was very amenable to taking a leave of absence from work for her safety, in particular her driving on a major highway when she is so tired, and allowing her focus on graduation, new employment.  I advised that she take an extended leave of absence and consider not returning to her position in [place] and look for position locally after she graduates.  Patient  prefers not to be on medication for this.  Referral to counseling.  Close follow-up to discuss symptoms and whether an SSRI would be appropriate        I have discontinued Delores G. Kirkeby's amoxicillin-clavulanate. I am also having her maintain her triamcinolone ointment, Cholecalciferol,  amphetamine-dextroamphetamine, amphetamine-dextroamphetamine, and amphetamine-dextroamphetamine.   No orders of the defined types were placed in this encounter.   Return precautions given.   Risks, benefits, and alternatives of the medications and treatment plan prescribed today were discussed, and patient expressed understanding.   Education regarding symptom management and diagnosis given to patient on AVS.  Continue to follow with [name], FNP for routine health maintenance.   Danielle Casey and I agreed with plan.   [name], FNP  I have spent 35 minutes with a patient including precharting, exam, counseling on depression, anxiety, sleep deprivation, and discussion plan of care.

## 2021-08-17 NOTE — Assessment & Plan Note (Signed)
Suboptimal at this time.  Long discussion as it relates to chronic sleep deprivation and concentration.  I agreed that we should not increase Adderall 5 mg at this time as I am concerned anxiety, insomnia would worsen.  We will continue Adderall 5 mg twice daily at this time.  Weight is stable.

## 2021-08-17 NOTE — Assessment & Plan Note (Signed)
Uncontrolled.  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   This is not optimal and I think it is led to her feeling overwhelmed, exacerbating anxiety depression.  We discussed coping mechanisms including sleep hygiene, exercise, eating healthy, and simply time for herself.  She was very amenable to taking a leave of absence from work for her safety, in particular her driving on a major highway when she is so tired, and allowing her focus on graduation, new employment.  I advised that she take an extended leave of absence and consider not returning to her position in [place] and look for position locally after she graduates.  Patient prefers not to be on medication for this.  Referral to counseling.  Close follow-up to discuss symptoms and whether an SSRI would be appropriate

## 2021-08-19 ENCOUNTER — Telehealth: Payer: Self-pay | Admitting: Family

## 2021-08-19 NOTE — Telephone Encounter (Signed)
Patient has chronic sleep deficiency and wants to know if she thinks melatonin will help? Or if she needs to see a specialist?   Please advise

## 2021-08-20 ENCOUNTER — Encounter: Payer: Self-pay | Admitting: Family

## 2021-08-20 NOTE — Telephone Encounter (Signed)
Please advise 

## 2021-08-23 NOTE — Telephone Encounter (Signed)
Call    yes for chronic insomnia on melatonin is very safe and advise her to take melatonin  Sch sooner f/u with me if needed

## 2021-08-23 NOTE — Telephone Encounter (Signed)
Message sent to patient

## 2021-09-16 ENCOUNTER — Other Ambulatory Visit: Payer: Self-pay | Admitting: Family

## 2021-09-16 ENCOUNTER — Telehealth: Payer: Self-pay | Admitting: Family

## 2021-09-16 DIAGNOSIS — R41840 Attention and concentration deficit: Secondary | ICD-10-CM

## 2021-09-16 NOTE — Telephone Encounter (Signed)
RX Refill: adderall Last Seen: 08-17-21 Last Ordered: 06-21-21 Next Appt: 09-28-21

## 2021-09-16 NOTE — Telephone Encounter (Signed)
error 

## 2021-09-17 ENCOUNTER — Other Ambulatory Visit: Payer: Self-pay | Admitting: Family

## 2021-09-17 DIAGNOSIS — R41840 Attention and concentration deficit: Secondary | ICD-10-CM

## 2021-09-17 MED ORDER — AMPHETAMINE-DEXTROAMPHETAMINE 5 MG PO TABS: 5.0000 mg | ORAL_TABLET | Freq: Two times a day (BID) | ORAL | 0 refills | Status: DC

## 2021-09-28 ENCOUNTER — Ambulatory Visit (INDEPENDENT_AMBULATORY_CARE_PROVIDER_SITE_OTHER): Payer: 59 | Admitting: Family

## 2021-09-28 ENCOUNTER — Other Ambulatory Visit: Payer: Self-pay

## 2021-09-28 ENCOUNTER — Encounter: Payer: Self-pay | Admitting: Family

## 2021-09-28 VITALS — BP 102/58 | HR 89 | Temp 98.9°F | Ht 60.0 in | Wt 103.4 lb

## 2021-09-28 DIAGNOSIS — F32A Depression, unspecified: Secondary | ICD-10-CM

## 2021-09-28 DIAGNOSIS — F419 Anxiety disorder, unspecified: Secondary | ICD-10-CM

## 2021-09-28 DIAGNOSIS — F909 Attention-deficit hyperactivity disorder, unspecified type: Secondary | ICD-10-CM

## 2021-09-28 MED ORDER — AMPHETAMINE-DEXTROAMPHETAMINE 10 MG PO TABS: 10.0000 mg | ORAL_TABLET | Freq: Two times a day (BID) | ORAL | 0 refills | Status: DC

## 2021-09-28 NOTE — Assessment & Plan Note (Signed)
Pleased to see much improved. She is doing well and sleep improved. Will monitor closely.

## 2021-09-28 NOTE — Progress Notes (Signed)
Subjective:    Patient ID: Danielle Casey, female    DOB: Mar 13, 1991, 30 y.o.   MRN: [id number]  CC: Danielle Casey is a 30 y.o. female who presents today for follow up.   HPI: Since last appointment, she is working a reduced work schedule at 20hrs/ week which is much better for her. She continues to be in school.  She is maintaining 'a better sleep schedule' and fatigue improved.   Anxiety is improved.   ADHD- compliant with adderal 5mg  BID however she doesn't notice symptom improvement with adderall. Feels she is not focused. No palpitations, cp.    Tried vyvanse , conserta and doesn't recall being helpful.    HISTORY:  Past Medical History:  Diagnosis Date   Migraines    Past Surgical History:  Procedure Laterality Date   NO PAST SURGERIES     Family History  Problem Relation Age of Onset   Breast cancer Mother 58   Mental illness Mother    Cervical cancer Mother    Breast cancer Maternal Grandmother 63   Arthritis Maternal Grandmother    Hypertension Maternal Grandmother    Diabetes Maternal Grandmother    Mental illness Maternal Grandmother    Colon cancer Maternal Uncle        unsure of age   Thyroid cancer Maternal Uncle 45   Cervical cancer Sister 28   Clotting disorder Neg Hx    Heart attack Neg Hx    Arrhythmia Neg Hx    Sudden Cardiac Death Neg Hx     Allergies: Patient has no known allergies. Current Outpatient Medications on File Prior to Visit  Medication Sig Dispense Refill   Cholecalciferol 1.25 MG (50000 UT) TABS 50,000 units PO qwk for 8 weeks. 8 tablet 0   triamcinolone ointment (KENALOG) 0.1 % Apply twice daily to affected areas as needed for rash. Avoid Face, groin and underarms 30 g 2   No current facility-administered medications on file prior to visit.    Social History   Tobacco Use   Smoking status: Former    Types: E-cigarettes   Smokeless tobacco: Current   Tobacco comments:    Juul regularly  Vaping Use   Vaping Use:  Every day  Substance Use Topics   Alcohol use: Yes    Alcohol/week: 1.0 - 2.0 standard drink    Types: 1 - 2 Standard drinks or equivalent per week    Comment: occasional, once per month   Drug use: No    Review of Systems  Constitutional:  Negative for chills and fever.  Respiratory:  Negative for cough.   Cardiovascular:  Negative for chest pain and palpitations.  Gastrointestinal:  Negative for nausea and vomiting.     Objective:    BP (!) 102/58 (BP Location: Left Arm, Patient Position: Sitting, Cuff Size: Normal)   Pulse 89   Temp 98.9 F (37.2 C) (Oral)   Ht 5' (1.524 m)   Wt 103 lb 6.4 oz (46.9 kg)   SpO2 98%   BMI 20.19 kg/m  BP Readings from Last 3 Encounters:  09/28/21 (!) 102/58  08/17/21 102/68  02/03/21 102/64   Wt Readings from Last 3 Encounters:  09/28/21 103 lb 6.4 oz (46.9 kg)  08/17/21 103 lb 6.4 oz (46.9 kg)  02/03/21 105 lb 9.6 oz (47.9 kg)    Physical Exam Vitals reviewed.  Constitutional:      Appearance: She is well-developed.  Eyes:     Conjunctiva/sclera: Conjunctivae normal.  Cardiovascular:     Rate and Rhythm: Normal rate and regular rhythm.     Pulses: Normal pulses.     Heart sounds: Normal heart sounds.  Pulmonary:     Effort: Pulmonary effort is normal.     Breath sounds: Normal breath sounds. No wheezing, rhonchi or rales.  Skin:    General: Skin is warm and dry.  Neurological:     Mental Status: She is alert.  Psychiatric:        Speech: Speech normal.        Behavior: Behavior normal.        Thought Content: Thought content normal.       Assessment & Plan:   Problem List Items Addressed This Visit       Other   ADHD - Primary    Suboptimal control. Start Adderall 10 mg in the morning, and she will use adderall 5 mg tablet in the afternoon with close attention to insomnia, increased anxiety, loss of appetite and let me know.  If ineffective, I have provided a prescription for Adderall 10 mg twice daily       Relevant Medications   amphetamine-dextroamphetamine (ADDERALL) 10 MG tablet   Anxiety and depression    Pleased to see much improved. She is doing well and sleep improved. Will monitor closely.        I have discontinued Armando Gang Konczal's amphetamine-dextroamphetamine, amphetamine-dextroamphetamine, and amphetamine-dextroamphetamine. I am also having her start on amphetamine-dextroamphetamine. Additionally, I am having her maintain her triamcinolone ointment and Cholecalciferol.   Meds ordered this encounter  Medications   amphetamine-dextroamphetamine (ADDERALL) 10 MG tablet    Sig: Take 1 tablet (10 mg total) by mouth 2 (two) times daily with a meal.    Dispense:  60 tablet    Refill:  0    Order Specific Question:   Supervising Provider    Answer:   Sherlene Shams [[id number]]    Return precautions given.   Risks, benefits, and alternatives of the medications and treatment plan prescribed today were discussed, and patient expressed understanding.   Education regarding symptom management and diagnosis given to patient on AVS.  Continue to follow with [name], FNP for routine health maintenance.   Danielle Casey and I agreed with plan.   [name], FNP

## 2021-09-28 NOTE — Patient Instructions (Signed)
We can increase Adderall from 5 mg to 10 mg however as I like you to watch very closely for appetite decreases, trouble sleeping, increased anxiety, I would advise you to start with taking Adderall 10 mg in the morning, and your usual 5 mg tablet in the afternoon.  In 1 month's time please call me let me know which regimen is most effective for you.

## 2021-09-28 NOTE — Assessment & Plan Note (Addendum)
Suboptimal control. Start Adderall 10 mg in the morning, and she will use adderall 5 mg tablet in the afternoon with close attention to insomnia, increased anxiety, loss of appetite and let me know.  If ineffective, I have provided a prescription for Adderall 10 mg twice daily. I looked up patient on  Controlled Substances Reporting System PMP AWARE and saw no activity that raised concern of inappropriate use.

## 2021-11-10 ENCOUNTER — Encounter: Payer: Self-pay | Admitting: Family

## 2021-12-07 ENCOUNTER — Other Ambulatory Visit: Payer: Self-pay | Admitting: Family

## 2021-12-07 DIAGNOSIS — F909 Attention-deficit hyperactivity disorder, unspecified type: Secondary | ICD-10-CM

## 2021-12-09 ENCOUNTER — Other Ambulatory Visit: Payer: Self-pay | Admitting: Family

## 2021-12-09 DIAGNOSIS — F909 Attention-deficit hyperactivity disorder, unspecified type: Secondary | ICD-10-CM

## 2021-12-09 MED ORDER — AMPHETAMINE-DEXTROAMPHETAMINE 10 MG PO TABS: 10.0000 mg | ORAL_TABLET | Freq: Two times a day (BID) | ORAL | 0 refills | Status: DC

## 2021-12-09 NOTE — Progress Notes (Signed)
I looked up patient on [place] Controlled Substances Reporting System PMP AWARE and saw no activity that raised concern of inappropriate use.   

## 2021-12-09 NOTE — Telephone Encounter (Signed)
Refilled: 09/28/2021 Last OV: 09/28/2021 Next OV: 02/25/2022

## 2022-01-10 IMAGING — MR MR BREAST BX W LOC DEV 1ST LESION IMAGE BX SPEC MR GUIDE*L*
9 of 12 series · 33 of 48 positions shown · IV contrast (4 ml gadavist)
Comparison: Previous exams.
COMPARISON: Previous exams.

Addendum:
CLINICAL DATA: Patient presents for MR guided core biopsy of the
LEFT breast.

EXAM:
MRI GUIDED CORE NEEDLE BIOPSY OF THE LEFT BREAST
TECHNIQUE: Multiplanar, multisequence MR imaging of the LEFT breast was
performed both before and after administration of intravenous
contrast.
CONTRAST:  4 mL Gadavist.

[Series 2: fiducial unilateral · sagittal · 2.0mm · 1.33mm/px · 3 of 52 slices shown]
[im 1/52]
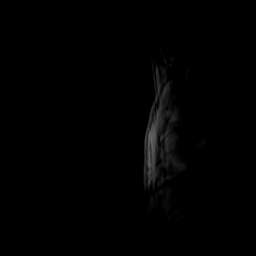
[im 26/52]
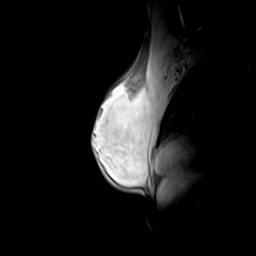
[im 52/52]
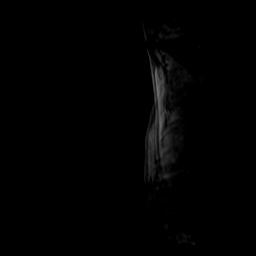

[Series 3: dynamic pre · axial · non-contrast · 1.3mm · 0.73mm/px · z∈[-102,+84]mm · 5 of 144 slices shown]
[im 1/144]
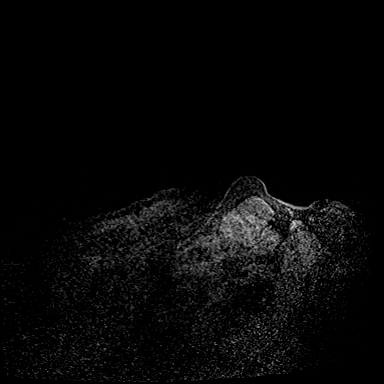
[im 36/144]
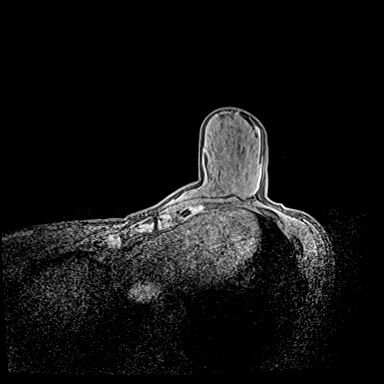
[im 72/144]
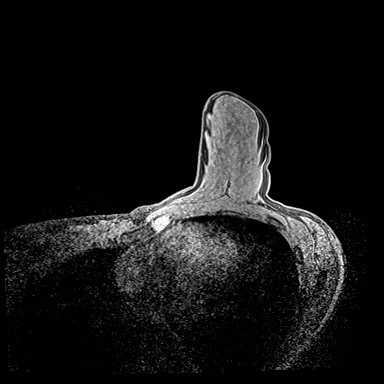
[im 108/144]
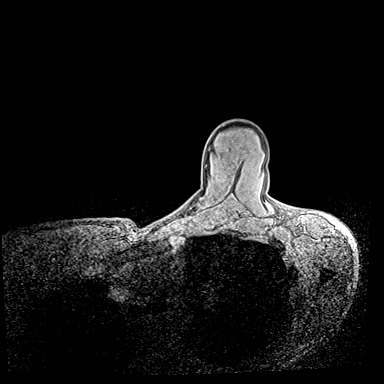
[im 144/144]
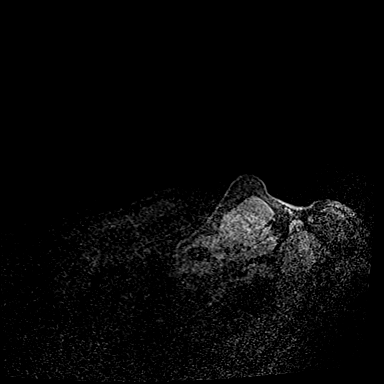

[Series 4: dynamic post 20 · axial · 1.3mm · 0.73mm/px · z∈[-102,+84]mm · 4 of 144 slices shown (1 of 2)]
[im 1/144]
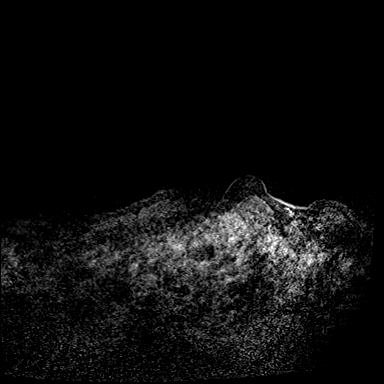
[im 48/144]
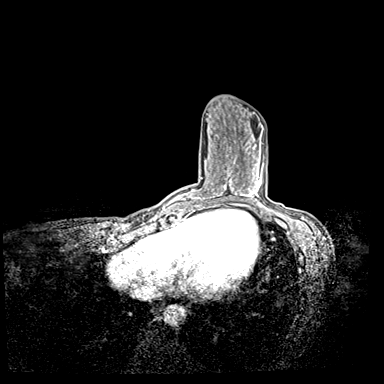
[im 96/144]
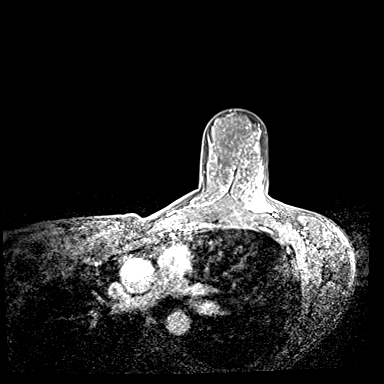
[im 144/144]
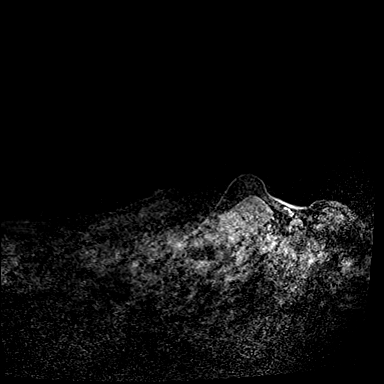

[Series 5: dynamic post 20 · axial · 1.3mm · 0.73mm/px · z∈[-102,+84]mm · 4 of 144 slices shown (2 of 2)]
[im 1/144]
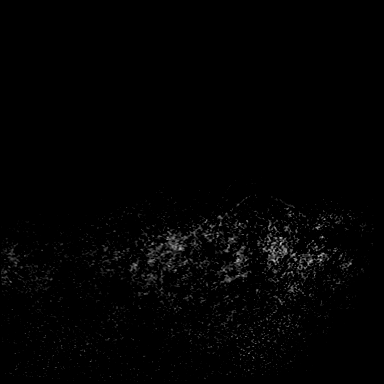
[im 48/144]
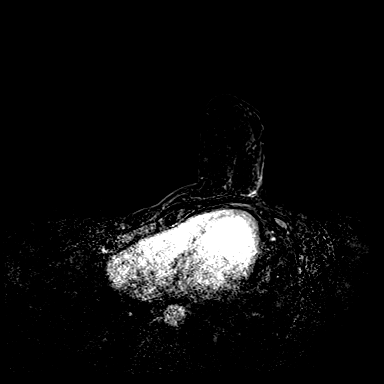
[im 96/144]
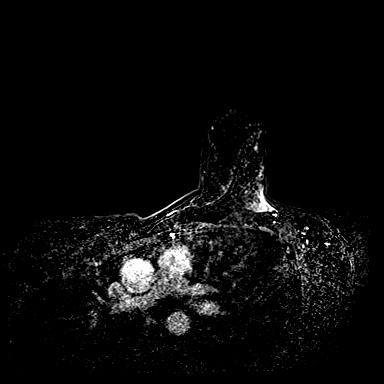
[im 144/144]
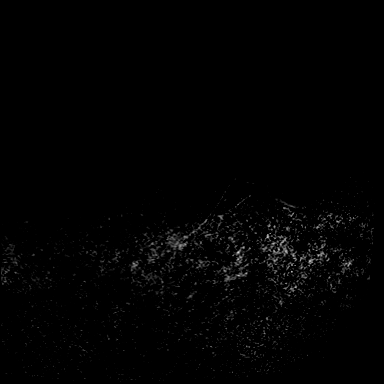

[Series 6: dynamic post 3 · axial · 1.3mm · 0.73mm/px · z∈[-102,+84]mm · 4 of 144 slices shown (1 of 2)]
[im 1/144]
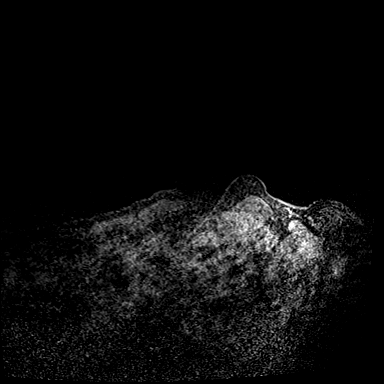
[im 48/144]
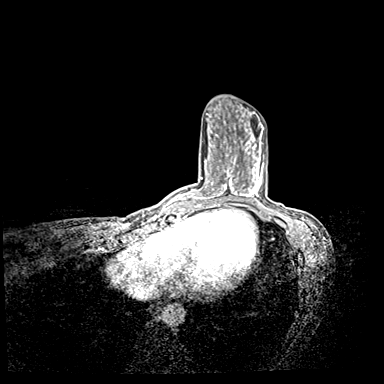
[im 96/144]
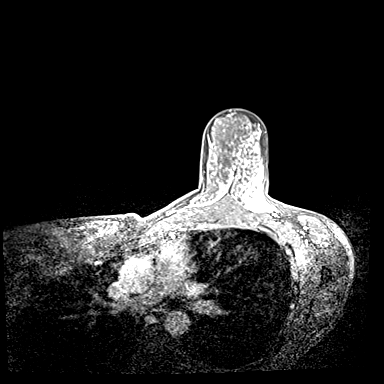
[im 144/144]
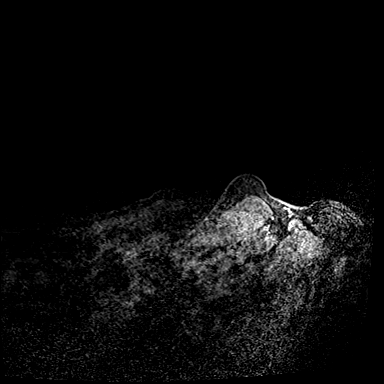

[Series 7: dynamic post 3 · axial · 1.3mm · 0.73mm/px · z∈[-102,+84]mm · 4 of 144 slices shown (2 of 2)]
[im 1/144]
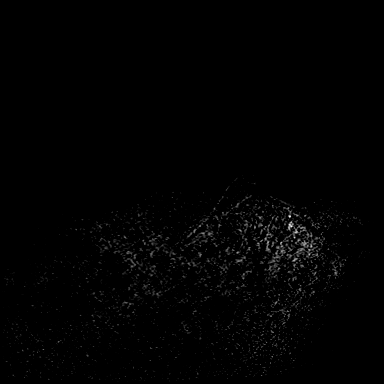
[im 48/144]
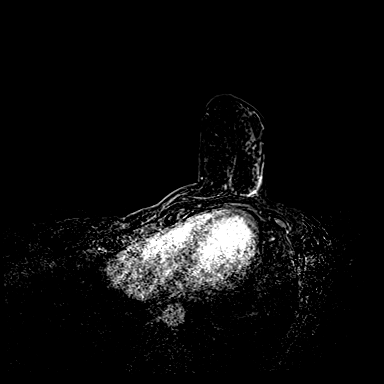
[im 96/144]
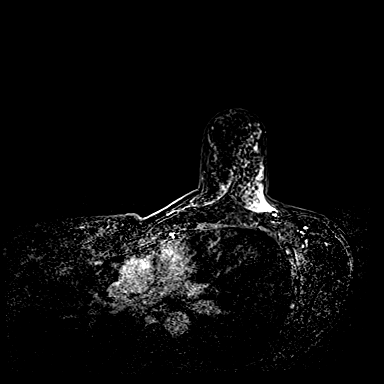
[im 144/144]
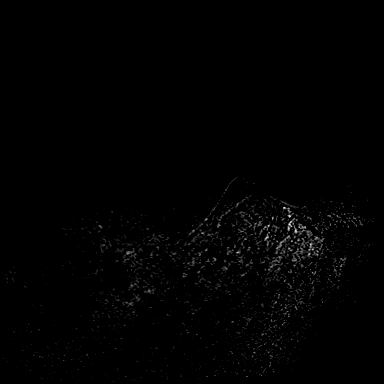

[Series 8: needle confirmation · axial · 1.3mm · 0.73mm/px · z∈[-102,+84]mm · 4 of 144 slices shown]
[im 1/144]
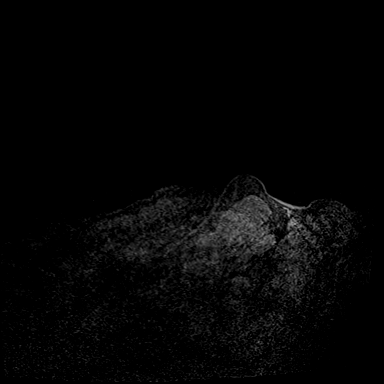
[im 48/144]
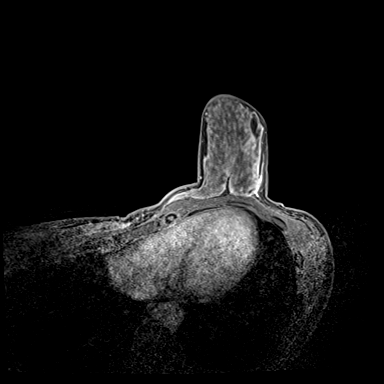
[im 96/144]
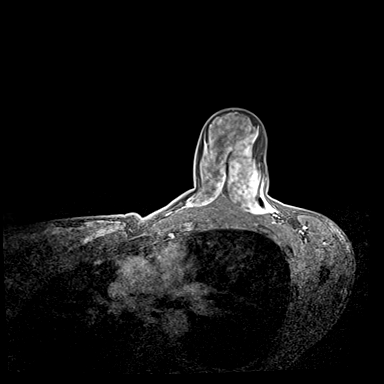
[im 144/144]
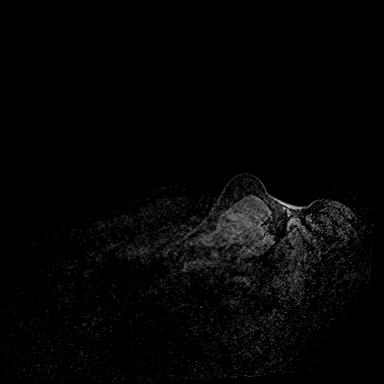

[Series 9: needle confirmation_sub · axial · 1.3mm · 0.73mm/px · z∈[-102,+84]mm · 4 of 144 slices shown]
[im 1/144]
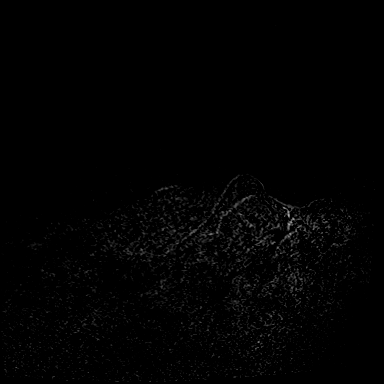
[im 48/144]
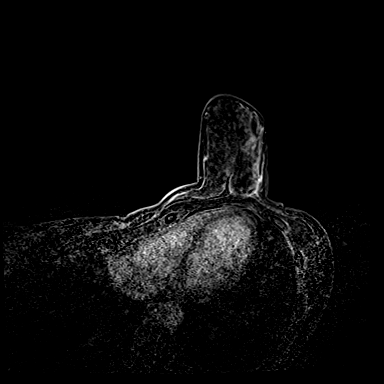
[im 96/144]
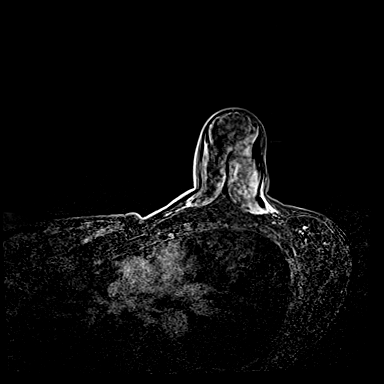
[im 144/144]
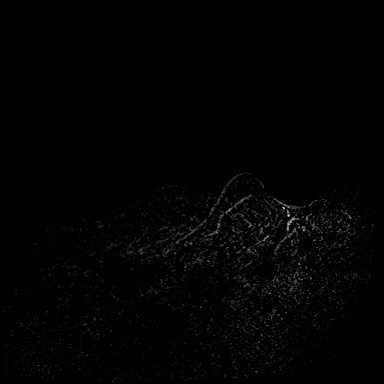

[Series 10: post bx · axial · 1.3mm · 0.73mm/px · 1 of 144 slices shown]
[im 1/144]
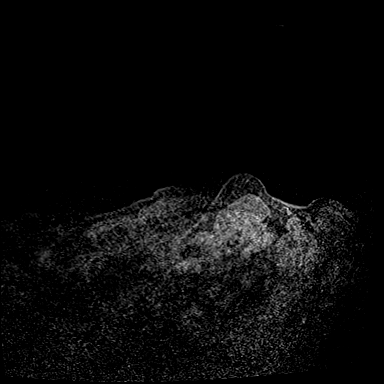

[33 of 48 positions shown; findings below may reference images not displayed]

FINDINGS: I met with the patient, and we discussed the procedure of MRI guided
biopsy, including risks, benefits, and alternatives. Specifically,
we discussed the risks of infection, bleeding, tissue injury, clip
migration, and inadequate sampling. Informed, written consent was
given. The usual time out protocol was performed immediately prior
to the procedure.

Using sterile technique, 1% Lidocaine, MRI guidance, and a 9 gauge
vacuum assisted device, biopsy was performed of enhancing mass in
the UPPER-OUTER QUADRANT of the LEFT breast using a LATERAL to
MEDIAL approach. At the conclusion of the procedure, a barbell
shaped tissue marker clip was deployed into the biopsy cavity.
Follow-up 2-view mammogram was performed and dictated separately.
IMPRESSION: MRI guided biopsy of LEFT breast mass.  No apparent complications.

ADDENDUM:
Pathology revealed HAMARTOMATOUS LESION of the LEFT breast, upper
inner. This was found to be concordant by Dr. Paulus N Ceejay.

Pathology results were discussed with the patient by telephone. The
patient reported doing well after the biopsy with tenderness at the
site. Post biopsy instructions and care were reviewed and questions
were answered. The patient was encouraged to call The [REDACTED] for any additional concerns. My direct phone
number was provided.

The patient was instructed to return for a bilateral breast MRI in 6
months, per protocol.

Obner Segoviano, FNP with [HOSPITAL] [HOSPITAL],
was notified via [REDACTED] message on 10/09/20 of biopsy results and a
recommendation for the patient to return for a bilateral breast MRI
in 6 months.

Pathology results reported by Nauji Vaikiski Antonovschi, RN on 10/09/2020.

*** End of Addendum ***
FINDINGS: I met with the patient, and we discussed the procedure of MRI guided
biopsy, including risks, benefits, and alternatives. Specifically,
we discussed the risks of infection, bleeding, tissue injury, clip
migration, and inadequate sampling. Informed, written consent was
given. The usual time out protocol was performed immediately prior
to the procedure.

Using sterile technique, 1% Lidocaine, MRI guidance, and a 9 gauge
vacuum assisted device, biopsy was performed of enhancing mass in
the UPPER-OUTER QUADRANT of the LEFT breast using a LATERAL to
MEDIAL approach. At the conclusion of the procedure, a barbell
shaped tissue marker clip was deployed into the biopsy cavity.
Follow-up 2-view mammogram was performed and dictated separately.
IMPRESSION: MRI guided biopsy of LEFT breast mass.  No apparent complications.

## 2022-02-04 ENCOUNTER — Other Ambulatory Visit: Payer: Self-pay | Admitting: Family

## 2022-02-04 DIAGNOSIS — F909 Attention-deficit hyperactivity disorder, unspecified type: Secondary | ICD-10-CM

## 2022-02-05 ENCOUNTER — Other Ambulatory Visit: Payer: Self-pay | Admitting: Family

## 2022-02-05 ENCOUNTER — Encounter: Payer: Self-pay | Admitting: Family

## 2022-02-05 DIAGNOSIS — F909 Attention-deficit hyperactivity disorder, unspecified type: Secondary | ICD-10-CM

## 2022-02-05 MED ORDER — AMPHETAMINE-DEXTROAMPHETAMINE 10 MG PO TABS: 10.0000 mg | ORAL_TABLET | Freq: Two times a day (BID) | ORAL | 0 refills | Status: DC

## 2022-02-05 NOTE — Progress Notes (Signed)
I looked up patient on [place] Controlled Substances Reporting System PMP AWARE and saw no activity that raised concern of inappropriate use.   

## 2022-02-25 ENCOUNTER — Encounter: Payer: Self-pay | Admitting: Family

## 2022-02-25 ENCOUNTER — Ambulatory Visit (INDEPENDENT_AMBULATORY_CARE_PROVIDER_SITE_OTHER): Payer: Managed Care, Other (non HMO) | Admitting: Family

## 2022-02-25 VITALS — BP 108/66 | HR 70 | Temp 98.6°F | Ht 60.0 in | Wt 103.2 lb

## 2022-02-25 DIAGNOSIS — Z803 Family history of malignant neoplasm of breast: Secondary | ICD-10-CM | POA: Diagnosis not present

## 2022-02-25 DIAGNOSIS — Z0000 Encounter for general adult medical examination without abnormal findings: Secondary | ICD-10-CM | POA: Diagnosis not present

## 2022-02-25 NOTE — Patient Instructions (Addendum)
Please to schedule breast MRI as discussed; you will be due for mammogram 6 months after MRI.  Please call me so I may order this ? ?[facility] ?[place], [place] [address] ?Phone [phone] ? ?Nice to see you! ? ?Health Maintenance, Female ?Adopting a healthy lifestyle and getting preventive care are important in promoting health and wellness. Ask your health care provider about: ?The right schedule for you to have regular tests and exams. ?Things you can do on your own to prevent diseases and keep yourself healthy. ?What should I know about diet, weight, and exercise? ?Eat a healthy diet ? ?Eat a diet that includes plenty of vegetables, fruits, low-fat dairy products, and lean protein. ?Do not eat a lot of foods that are high in solid fats, added sugars, or sodium. ?Maintain a healthy weight ?Body mass index (BMI) is used to identify weight problems. It estimates body fat based on height and weight. Your health care provider can help determine your BMI and help you achieve or maintain a healthy weight. ?Get regular exercise ?Get regular exercise. This is one of the most important things you can do for your health. Most adults should: ?Exercise for at least 150 minutes each week. The exercise should increase your heart rate and make you sweat (moderate-intensity exercise). ?Do strengthening exercises at least twice a week. This is in addition to the moderate-intensity exercise. ?Spend less time sitting. Even light physical activity can be beneficial. ?Watch cholesterol and blood lipids ?Have your blood tested for lipids and cholesterol at 33 years of age, then have this test every 5 years. ?Have your cholesterol levels checked more often if: ?Your lipid or cholesterol levels are high. ?You are older than 33 years of age. ?You are at high risk for heart disease. ?What should I know about cancer screening? ?Depending on your health history and family history, you  may need to have cancer screening at various ages. This may include screening for: ?Breast cancer. ?Cervical cancer. ?Colorectal cancer. ?Skin cancer. ?Lung cancer. ?What should I know about heart disease, diabetes, and high blood pressure? ?Blood pressure and heart disease ?High blood pressure causes heart disease and increases the risk of stroke. This is more likely to develop in people who have high blood pressure readings or are overweight. ?Have your blood pressure checked: ?Every 3-5 years if you are 35-39 years of age. ?Every year if you are 68 years old or older. ?Diabetes ?Have regular diabetes screenings. This checks your fasting blood sugar level. Have the screening done: ?Once every three years after age 37 if you are at a normal weight and have a low risk for diabetes. ?More often and at a younger age if you are overweight or have a high risk for diabetes. ?What should I know about preventing infection? ?Hepatitis B ?If you have a higher risk for hepatitis B, you should be screened for this virus. Talk with your health care provider to find out if you are at risk for hepatitis B infection. ?Hepatitis C ?Testing is recommended for: ?Everyone born from [birth year] through 1967. ?Anyone with known risk factors for hepatitis C. ?Sexually transmitted infections (STIs) ?Get screened for STIs, including gonorrhea and chlamydia, if: ?You are sexually active and are younger than 33 years of age. ?You are older than 33 years of age and your health care provider tells you that you are at risk for this type of infection. ?Your sexual activity has changed since  you were last screened, and you are at increased risk for chlamydia or gonorrhea. Ask your health care provider if you are at risk. ?Ask your health care provider about whether you are at high risk for HIV. Your health care provider may recommend a prescription medicine to help prevent HIV infection. If you choose to take medicine to prevent HIV, you should first  get tested for HIV. You should then be tested every 3 months for as long as you are taking the medicine. ?Pregnancy ?If you are about to stop having your period (premenopausal) and you may become pregnant, seek counseling before you get pregnant. ?Take 400 to 800 micrograms (mcg) of folic acid every day if you become pregnant. ?Ask for birth control (contraception) if you want to prevent pregnancy. ?Osteoporosis and menopause ?Osteoporosis is a disease in which the bones lose minerals and strength with aging. This can result in bone fractures. If you are 43 years old or older, or if you are at risk for osteoporosis and fractures, ask your health care provider if you should: ?Be screened for bone loss. ?Take a calcium or vitamin D supplement to lower your risk of fractures. ?Be given hormone replacement therapy (HRT) to treat symptoms of menopause. ?Follow these instructions at home: ?Alcohol use ?Do not drink alcohol if: ?Your health care provider tells you not to drink. ?You are pregnant, may be pregnant, or are planning to become pregnant. ?If you drink alcohol: ?Limit how much you have to: ?0-1 drink a day. ?Know how much alcohol is in your drink. In the [place] one drink equals one 12 oz bottle of beer (355 mL), one 5 oz glass of wine (148 mL), or one 1? oz glass of hard liquor (44 mL). ?Lifestyle ?Do not use any products that contain nicotine or tobacco. These products include cigarettes, chewing tobacco, and vaping devices, such as e-cigarettes. If you need help quitting, ask your health care provider. ?Do not use street drugs. ?Do not share needles. ?Ask your health care provider for help if you need support or information about quitting drugs. ?General instructions ?Schedule regular health, dental, and eye exams. ?Stay current with your vaccines. ?Tell your health care provider if: ?You often feel depressed. ?You have ever been abused or do not feel safe at home. ?Summary ?Adopting a healthy lifestyle and  getting preventive care are important in promoting health and wellness. ?Follow your health care provider's instructions about healthy diet, exercising, and getting tested or screened for diseases. ?Follow your health care provider's instructions on monitoring your cholesterol and blood pressure. ?This information is not intended to replace advice given to you by your health care provider. Make sure you discuss any questions you have with your health care provider. ?Document Revised: 04/05/2021 Document Reviewed: 04/05/2021 ?Elsevier Patient Education ? 2022 [place]. ? ? ? ?

## 2022-02-25 NOTE — Progress Notes (Signed)
? ?Subjective:  ? ? Patient ID: Danielle Casey, female    DOB: 27-Nov-1991, 31 y.o.   MRN: [id number] ? ?CC: Danielle Casey is a 31 y.o. female who presents today for physical exam.   ? ?HPI: She is doing well ?Happy with work; she is working first shift.  ? ? ? ?Family history breast cancer and cervical cancer ?Colorectal Cancer Screening: No first-degree relative ?Breast Cancer Screening: Mammogram overdue for bilateral breast MRI due to increased breast cancer risk; she would then need screening tomography 6 months later ?Cervical Cancer Screening: UTD, 2 years ago negative HPV, malignancy ? ? ?      Tetanus - UTD ?Labs: Screening labs today. ?Exercise: Gets regular exercise.   ?Alcohol use: Occasionally ?Smoking/tobacco use: former smoker.   ? ? ?HISTORY:  ?Past Medical History:  ?Diagnosis Date  ? Migraines   ?  ?Past Surgical History:  ?Procedure Laterality Date  ? NO PAST SURGERIES    ? ?Family History  ?Problem Relation Age of Onset  ? Breast cancer Mother 69  ? Mental illness Mother   ? Cervical cancer Mother   ? Breast cancer Maternal Grandmother 40  ? Arthritis Maternal Grandmother   ? Hypertension Maternal Grandmother   ? Diabetes Maternal Grandmother   ? Mental illness Maternal Grandmother   ? Colon cancer Maternal Uncle   ?     unsure of age  ? Thyroid cancer Maternal Uncle 45  ? Cervical cancer Sister 63  ? Clotting disorder Neg Hx   ? Heart attack Neg Hx   ? Arrhythmia Neg Hx   ? Sudden Cardiac Death Neg Hx   ? ?  ? ?ALLERGIES: Patient has no known allergies. ? ?Current Outpatient Medications on File Prior to Visit  ?Medication Sig Dispense Refill  ? amphetamine-dextroamphetamine (ADDERALL) 10 MG tablet Take 1 tablet (10 mg total) by mouth 2 (two) times daily with a meal. 60 tablet 0  ? amphetamine-dextroamphetamine (ADDERALL) 10 MG tablet Take 1 tablet (10 mg total) by mouth 2 (two) times daily. 60 tablet 0  ? amphetamine-dextroamphetamine (ADDERALL) 10 MG tablet Take 1 tablet (10 mg total) by  mouth 2 (two) times daily. 60 tablet 0  ? ?No current facility-administered medications on file prior to visit.  ? ? ?Social History  ? ?Tobacco Use  ? Smoking status: Former  ?  Types: E-cigarettes  ? Smokeless tobacco: Current  ? Tobacco comments:  ?  Juul regularly  ?Vaping Use  ? Vaping Use: Every day  ?Substance Use Topics  ? Alcohol use: Yes  ?  Alcohol/week: 1.0 - 2.0 standard drink  ?  Types: 1 - 2 Standard drinks or equivalent per week  ?  Comment: occasional, once per month  ? Drug use: No  ? ? ?Review of Systems  ?Constitutional:  Negative for chills, fever and unexpected weight change.  ?HENT:  Negative for congestion.   ?Respiratory:  Negative for cough.   ?Cardiovascular:  Negative for chest pain, palpitations and leg swelling.  ?Gastrointestinal:  Negative for nausea and vomiting.  ?Musculoskeletal:  Negative for arthralgias and myalgias.  ?Skin:  Negative for rash.  ?Neurological:  Negative for headaches.  ?Hematological:  Negative for adenopathy.  ?Psychiatric/Behavioral:  Negative for confusion.   ?   ?Objective:  ?  ?BP 108/66 (BP Location: Left Arm, Patient Position: Sitting, Cuff Size: Normal)   Pulse 70   Temp 98.6 ?F (37 ?C) (Oral)   Ht 5' (1.524 m)   Wt 103  lb 3.2 oz (46.8 kg)   SpO2 98%   BMI 20.15 kg/m?  ? ?BP Readings from Last 3 Encounters:  ?02/25/22 108/66  ?09/28/21 (!) 102/58  ?08/17/21 102/68  ? ?Wt Readings from Last 3 Encounters:  ?02/25/22 103 lb 3.2 oz (46.8 kg)  ?09/28/21 103 lb 6.4 oz (46.9 kg)  ?08/17/21 103 lb 6.4 oz (46.9 kg)  ? ? ?Physical Exam ?Vitals reviewed.  ?Constitutional:   ?   Appearance: Normal appearance. She is well-developed.  ?Eyes:  ?   Conjunctiva/sclera: Conjunctivae normal.  ?Neck:  ?   Thyroid: No thyroid mass or thyromegaly.  ?Cardiovascular:  ?   Rate and Rhythm: Normal rate and regular rhythm.  ?   Pulses: Normal pulses.  ?   Heart sounds: Normal heart sounds.  ?Pulmonary:  ?   Effort: Pulmonary effort is normal.  ?   Breath sounds: Normal breath  sounds. No wheezing, rhonchi or rales.  ?Chest:  ?Breasts: ?   Breasts are symmetrical.  ?   Right: No inverted nipple, mass, nipple discharge, skin change or tenderness.  ?   Left: Mass present. No inverted nipple, nipple discharge, skin change or tenderness.  ? ? ?   Comments: Left breast mass approx 12 oclock ?Abdominal:  ?   General: Bowel sounds are normal. There is no distension.  ?   Palpations: Abdomen is soft. Abdomen is not rigid. There is no fluid wave or mass.  ?   Tenderness: There is no abdominal tenderness. There is no guarding or rebound.  ?Lymphadenopathy:  ?   Head:  ?   Right side of head: No submental, submandibular, tonsillar, preauricular, posterior auricular or occipital adenopathy.  ?   Left side of head: No submental, submandibular, tonsillar, preauricular, posterior auricular or occipital adenopathy.  ?   Cervical: No cervical adenopathy.  ?   Right cervical: No superficial, deep or posterior cervical adenopathy. ?   Left cervical: No superficial, deep or posterior cervical adenopathy.  ?Skin: ?   General: Skin is warm and dry.  ?Neurological:  ?   Mental Status: She is alert.  ?Psychiatric:     ?   Speech: Speech normal.     ?   Behavior: Behavior normal.     ?   Thought Content: Thought content normal.  ? ? ?   ?Assessment & Plan:  ? ?Problem List Items Addressed This Visit   ? ?  ? Other  ? Family history of breast cancer  ?  MRI breasts order. She will schedule breast MRI and understands due for mammogram 6 months after MRI. We continue both screens annually indefinitely due to elevated lifetime risk.  She verbalized understanding and is in agreement with plan ?  ?  ? Routine physical examination - Primary  ?  Clinical breast exam performed.  Noted left breast mass on MRI breast which I ordered.  deferred pelvic exam in the absence of complaints and Pap smear is up-to-date.  Encouraged starting exercise program ?  ?  ? Relevant Orders  ? CBC with Differential/Platelet (Completed)  ?  Comprehensive metabolic panel (Completed)  ? Hemoglobin A1c (Completed)  ? Lipid panel (Completed)  ? TSH (Completed)  ? VITAMIN D 25 Hydroxy (Vit-D Deficiency, Fractures) (Completed)  ? MR BREAST BILATERAL W WO CONTRAST INC CAD  ? HIV Antibody (routine testing w rflx) (Completed)  ? Acute Hep Panel & Hep B Surface Ab (Completed)  ? RPR (Completed)  ? ? ? ?I have discontinued  Armando GangIsabel G. Gust's triamcinolone ointment and Cholecalciferol. I am also having her maintain her amphetamine-dextroamphetamine, amphetamine-dextroamphetamine, and amphetamine-dextroamphetamine. ? ? ?No orders of the defined types were placed in this encounter. ? ? ?Return precautions given.  ? ?Risks, benefits, and alternatives of the medications and treatment plan prescribed today were discussed, and patient expressed understanding.  ? ?Education regarding symptom management and diagnosis given to patient on AVS.  ? ?Continue to follow with Allegra GranaArnett, Briceyda Abdullah G, FNP for routine health maintenance.  ? ?Sueanne MargaritaIsabel G Redner and I agreed with plan.  ? ?Rennie PlowmanMargaret Jhoanna Heyde, FNP ? ? ? ?

## 2022-02-26 LAB — COMPREHENSIVE METABOLIC PANEL
ALT: 14 IU/L (ref 0–32)
AST: 21 IU/L (ref 0–40)
Albumin/Globulin Ratio: 1.8 (ref 1.2–2.2)
Albumin: 4.8 g/dL (ref 3.8–4.8)
Alkaline Phosphatase: 71 IU/L (ref 44–121)
BUN/Creatinine Ratio: 16 (ref 9–23)
BUN: 12 mg/dL (ref 6–20)
Bilirubin Total: 0.6 mg/dL (ref 0.0–1.2)
CO2: 20 mmol/L (ref 20–29)
Calcium: 9.3 mg/dL (ref 8.7–10.2)
Chloride: 101 mmol/L (ref 96–106)
Creatinine, Ser: 0.75 mg/dL (ref 0.57–1.00)
Globulin, Total: 2.6 g/dL (ref 1.5–4.5)
Glucose: 82 mg/dL (ref 70–99)
Potassium: 3.8 mmol/L (ref 3.5–5.2)
Sodium: 139 mmol/L (ref 134–144)
Total Protein: 7.4 g/dL (ref 6.0–8.5)
eGFR: 108 mL/min/{1.73_m2} (ref >59)

## 2022-02-26 LAB — LIPID PANEL
Chol/HDL Ratio: 2.1 ratio (ref 0.0–4.4)
Cholesterol, Total: 187 mg/dL (ref 100–199)
HDL: 91 mg/dL (ref >39)
LDL Chol Calc (NIH): 86 mg/dL (ref 0–99)
Triglycerides: 51 mg/dL (ref 0–149)
VLDL Cholesterol Cal: 10 mg/dL (ref 5–40)

## 2022-02-26 LAB — CBC WITH DIFFERENTIAL/PLATELET
Basophils Absolute: 0.1 10*3/uL (ref 0.0–0.2)
Basos: 1 %
EOS (ABSOLUTE): 0.1 10*3/uL (ref 0.0–0.4)
Eos: 1 %
Hematocrit: 40.8 % (ref 34.0–46.6)
Hemoglobin: 13.3 g/dL (ref 11.1–15.9)
Immature Grans (Abs): 0 10*3/uL (ref 0.0–0.1)
Immature Granulocytes: 0 %
Lymphocytes Absolute: 2.9 10*3/uL (ref 0.7–3.1)
Lymphs: 45 %
MCH: 28.5 pg (ref 26.6–33.0)
MCHC: 32.6 g/dL (ref 31.5–35.7)
MCV: 87 fL (ref 79–97)
Monocytes Absolute: 0.4 10*3/uL (ref 0.1–0.9)
Monocytes: 6 %
Neutrophils Absolute: 3 10*3/uL (ref 1.4–7.0)
Neutrophils: 47 %
Platelets: 255 10*3/uL (ref 150–450)
RBC: 4.67 x10E6/uL (ref 3.77–5.28)
RDW: 11.9 % (ref 11.7–15.4)
WBC: 6.4 10*3/uL (ref 3.4–10.8)

## 2022-02-26 LAB — RPR: RPR Ser Ql: NONREACTIVE

## 2022-02-26 LAB — HEMOGLOBIN A1C
Est. average glucose Bld gHb Est-mCnc: 108 mg/dL
Hgb A1c MFr Bld: 5.4 % (ref 4.8–5.6)

## 2022-02-26 LAB — TSH: TSH: 1.95 u[IU]/mL (ref 0.450–4.500)

## 2022-02-26 LAB — ACUTE HEP PANEL AND HEP B SURFACE AB
Hep A IgM: NEGATIVE
Hep B C IgM: NEGATIVE
Hep C Virus Ab: NONREACTIVE
Hepatitis B Surf Ab Quant: 948.4 m[IU]/mL (ref >9.9)
Hepatitis B Surface Ag: NEGATIVE

## 2022-02-26 LAB — HIV ANTIBODY (ROUTINE TESTING W REFLEX): HIV Screen 4th Generation wRfx: NONREACTIVE

## 2022-02-26 LAB — VITAMIN D 25 HYDROXY (VIT D DEFICIENCY, FRACTURES): Vit D, 25-Hydroxy: 23.7 ng/mL — ABNORMAL LOW (ref 30.0–100.0)

## 2022-03-01 NOTE — Assessment & Plan Note (Addendum)
Clinical breast exam performed.  Noted left breast mass on MRI breast which I ordered.  deferred pelvic exam in the absence of complaints and Pap smear is up-to-date.  Encouraged starting exercise program ?

## 2022-03-01 NOTE — Assessment & Plan Note (Signed)
MRI breasts order. She will schedule breast MRI and understands due for mammogram 6 months after MRI. We continue both screens annually indefinitely due to elevated lifetime risk.  She verbalized understanding and is in agreement with plan ?

## 2022-03-24 ENCOUNTER — Telehealth: Payer: Self-pay

## 2022-03-24 NOTE — Telephone Encounter (Signed)
LMTCB to go over mychart results ?

## 2022-03-31 ENCOUNTER — Telehealth: Payer: Self-pay | Admitting: Family

## 2022-03-31 NOTE — Telephone Encounter (Signed)
Lft pt vm to call ofc . Thank you! 

## 2022-04-06 NOTE — Telephone Encounter (Signed)
Patient returned Danielle Casey's call.  Please call. 

## 2022-04-18 NOTE — Telephone Encounter (Signed)
Patient returned Danielle Casey's call.  Please call. 

## 2022-04-20 ENCOUNTER — Other Ambulatory Visit: Payer: Self-pay | Admitting: Family

## 2022-04-20 DIAGNOSIS — Z803 Family history of malignant neoplasm of breast: Secondary | ICD-10-CM

## 2022-04-22 ENCOUNTER — Telehealth: Payer: Self-pay | Admitting: Family

## 2022-04-22 NOTE — Telephone Encounter (Signed)
Lft pt vm to call ofc . thanks 

## 2022-06-16 ENCOUNTER — Telehealth: Payer: Self-pay | Admitting: Family

## 2022-06-16 NOTE — Telephone Encounter (Signed)
Pt called stating she no longer has the same ins she has [employer] for the MRI breast. Pt will call with new ins information. Thanks

## 2022-07-18 ENCOUNTER — Telehealth: Payer: Self-pay

## 2022-07-18 NOTE — Telephone Encounter (Signed)
Patient states she would like to give [employer] updated insurance information so she can send out a referral.

## 2022-08-08 ENCOUNTER — Ambulatory Visit
Admission: RE | Admit: 2022-08-08 | Discharge: 2022-08-08 | Disposition: A | Discharge status: Home / Self Care | Payer: Federal, State, Local not specified - PPO | Source: Ambulatory Visit | Attending: Family | Admitting: Family

## 2022-08-08 DIAGNOSIS — Z1231 Encounter for screening mammogram for malignant neoplasm of breast: Secondary | ICD-10-CM | POA: Diagnosis not present

## 2022-08-08 DIAGNOSIS — Z803 Family history of malignant neoplasm of breast: Secondary | ICD-10-CM | POA: Insufficient documentation

## 2022-08-10 ENCOUNTER — Other Ambulatory Visit: Payer: Self-pay | Admitting: Family

## 2022-08-10 DIAGNOSIS — R928 Other abnormal and inconclusive findings on diagnostic imaging of breast: Secondary | ICD-10-CM

## 2022-08-10 DIAGNOSIS — N6489 Other specified disorders of breast: Secondary | ICD-10-CM

## 2022-08-31 ENCOUNTER — Ambulatory Visit
Admission: RE | Admit: 2022-08-31 | Discharge: 2022-08-31 | Disposition: A | Discharge status: Home / Self Care | Payer: Federal, State, Local not specified - PPO | Source: Ambulatory Visit | Attending: Family | Admitting: Family

## 2022-08-31 DIAGNOSIS — N6489 Other specified disorders of breast: Secondary | ICD-10-CM | POA: Insufficient documentation

## 2022-08-31 DIAGNOSIS — R928 Other abnormal and inconclusive findings on diagnostic imaging of breast: Secondary | ICD-10-CM | POA: Diagnosis not present

## 2022-08-31 DIAGNOSIS — R922 Inconclusive mammogram: Secondary | ICD-10-CM | POA: Diagnosis not present

## 2022-09-21 ENCOUNTER — Ambulatory Visit: Payer: Federal, State, Local not specified - PPO | Admitting: Family

## 2022-09-21 ENCOUNTER — Encounter: Payer: Self-pay | Admitting: Family

## 2022-09-21 VITALS — BP 104/80 | HR 82 | Temp 98.7°F | Ht 60.0 in | Wt 98.8 lb

## 2022-09-21 DIAGNOSIS — R509 Fever, unspecified: Secondary | ICD-10-CM

## 2022-09-21 DIAGNOSIS — R0981 Nasal congestion: Secondary | ICD-10-CM

## 2022-09-21 LAB — POCT INFLUENZA A/B
Influenza A, POC: NEGATIVE
Influenza B, POC: NEGATIVE

## 2022-09-21 LAB — POC COVID19 BINAXNOW: SARS Coronavirus 2 Ag: NEGATIVE

## 2022-09-21 LAB — POCT RAPID STREP A (OFFICE): Rapid Strep A Screen: NEGATIVE

## 2022-09-21 NOTE — Patient Instructions (Signed)
Let me know if symptoms persist.

## 2022-09-21 NOTE — Progress Notes (Signed)
Subjective:    Patient ID: Danielle Casey, female    DOB: 09-Sep-1991, 31 y.o.   MRN: [id number]  CC: Danielle Casey is a 31 y.o. female who presents today for an acute visit.    HPI: Complains of slight dry cough, congestion, started today,however she feels improved.   Chills and fever which started 3 days, since resolved.  Some nausea, since resolved.   No sob, wheezing, sore throat, sinus pain.    H/o seasonal allergies  She been excredin with some relief.           HISTORY:  Past Medical History:  Diagnosis Date   Migraines    Past Surgical History:  Procedure Laterality Date   NO PAST SURGERIES     Family History  Problem Relation Age of Onset   Breast cancer Mother 80   Mental illness Mother    Cervical cancer Mother    Breast cancer Maternal Grandmother 52   Arthritis Maternal Grandmother    Hypertension Maternal Grandmother    Diabetes Maternal Grandmother    Mental illness Maternal Grandmother    Colon cancer Maternal Uncle        unsure of age   Thyroid cancer Maternal Uncle 45   Cervical cancer Sister 58   Clotting disorder Neg Hx    Heart attack Neg Hx    Arrhythmia Neg Hx    Sudden Cardiac Death Neg Hx     Allergies: Patient has no known allergies. Current Outpatient Medications on File Prior to Visit  Medication Sig Dispense Refill   amphetamine-dextroamphetamine (ADDERALL) 10 MG tablet Take 1 tablet (10 mg total) by mouth 2 (two) times daily with a meal. 60 tablet 0   amphetamine-dextroamphetamine (ADDERALL) 10 MG tablet Take 1 tablet (10 mg total) by mouth 2 (two) times daily. 60 tablet 0   amphetamine-dextroamphetamine (ADDERALL) 10 MG tablet Take 1 tablet (10 mg total) by mouth 2 (two) times daily. 60 tablet 0   No current facility-administered medications on file prior to visit.    Social History   Tobacco Use   Smoking status: Former    Types: E-cigarettes   Smokeless tobacco: Current   Tobacco comments:    Juul  regularly  Vaping Use   Vaping Use: Every day  Substance Use Topics   Alcohol use: Yes    Alcohol/week: 1.0 - 2.0 standard drink of alcohol    Types: 1 - 2 Standard drinks or equivalent per week    Comment: occasional, once per month   Drug use: No    Review of Systems  Constitutional:  Negative for chills and fever.  HENT:  Positive for congestion.   Respiratory:  Positive for cough. Negative for shortness of breath.   Cardiovascular:  Negative for chest pain and palpitations.  Gastrointestinal:  Negative for nausea and vomiting.      Objective:    BP 104/80 (BP Location: Left Arm, Patient Position: Sitting, Cuff Size: Normal)   Pulse 82   Temp 98.7 F (37.1 C) (Oral)   Ht 5' (1.524 m)   Wt 98 lb 12.8 oz (44.8 kg)   SpO2 99%   BMI 19.30 kg/m    Physical Exam Vitals reviewed.  Constitutional:      Appearance: She is well-developed.  HENT:     Head: Normocephalic and atraumatic.     Right Ear: Hearing, tympanic membrane, ear canal and external ear normal. No decreased hearing noted. No drainage, swelling or tenderness. No middle ear  effusion. No foreign body. Tympanic membrane is not erythematous or bulging.     Left Ear: Hearing, tympanic membrane, ear canal and external ear normal. No decreased hearing noted. No drainage, swelling or tenderness.  No middle ear effusion. No foreign body. Tympanic membrane is not erythematous or bulging.     Nose: Nose normal. No rhinorrhea.     Right Sinus: No maxillary sinus tenderness or frontal sinus tenderness.     Left Sinus: No maxillary sinus tenderness or frontal sinus tenderness.     Mouth/Throat:     Pharynx: Uvula midline. No oropharyngeal exudate or posterior oropharyngeal erythema.     Tonsils: No tonsillar abscesses.  Eyes:     Conjunctiva/sclera: Conjunctivae normal.  Cardiovascular:     Rate and Rhythm: Regular rhythm.     Pulses: Normal pulses.     Heart sounds: Normal heart sounds.  Pulmonary:     Effort:  Pulmonary effort is normal.     Breath sounds: Normal breath sounds. No wheezing, rhonchi or rales.  Lymphadenopathy:     Head:     Right side of head: No submental, submandibular, tonsillar, preauricular, posterior auricular or occipital adenopathy.     Left side of head: No submental, submandibular, tonsillar, preauricular, posterior auricular or occipital adenopathy.     Cervical: No cervical adenopathy.  Skin:    General: Skin is warm and dry.  Neurological:     Mental Status: She is alert.  Psychiatric:        Speech: Speech normal.        Behavior: Behavior normal.        Thought Content: Thought content normal.        Assessment & Plan:   Problem List Items Addressed This Visit       Respiratory   Sinus congestion    Afebrile.  No acute respiratory distress.  Symptoms mild at this time.  Point-of-care COVID flu and strep are negative.  Pending PCR COVID and flu as we may have tested on early side.  She will let me know how she is doing      Other Visit Diagnoses     Fever, unspecified fever cause    -  Primary   Relevant Orders   POCT Influenza A/B (Completed)   POCT rapid strep A (Completed)   POC COVID-19 (Completed)          I am having Danielle Casey maintain her amphetamine-dextroamphetamine, amphetamine-dextroamphetamine, and amphetamine-dextroamphetamine.   No orders of the defined types were placed in this encounter.   Return precautions given.   Risks, benefits, and alternatives of the medications and treatment plan prescribed today were discussed, and patient expressed understanding.   Education regarding symptom management and diagnosis given to patient on AVS.  Continue to follow with Burnard Hawthorne, FNP for routine health maintenance.   Danielle Casey and I agreed with plan.   Mable Paris, FNP

## 2022-09-21 NOTE — Assessment & Plan Note (Addendum)
Afebrile.  No acute respiratory distress.  Symptoms mild at this time.  Point-of-care COVID flu and strep are negative.  Pending PCR COVID and flu as we may have tested on early side.  She will let me know how she is doing

## 2022-09-22 LAB — NOVEL CORONAVIRUS, NAA: SARS-CoV-2, NAA: NOT DETECTED

## 2022-10-19 ENCOUNTER — Other Ambulatory Visit: Payer: Self-pay | Admitting: Family

## 2022-10-19 DIAGNOSIS — F909 Attention-deficit hyperactivity disorder, unspecified type: Secondary | ICD-10-CM

## 2022-10-21 NOTE — Telephone Encounter (Signed)
Okay to refill?  LOV: 09/21/22 NOV: N/A

## 2022-10-24 ENCOUNTER — Other Ambulatory Visit: Payer: Self-pay | Admitting: Family

## 2022-10-24 DIAGNOSIS — F909 Attention-deficit hyperactivity disorder, unspecified type: Secondary | ICD-10-CM

## 2022-10-24 MED ORDER — AMPHETAMINE-DEXTROAMPHETAMINE 10 MG PO TABS: 10.0000 mg | ORAL_TABLET | Freq: Two times a day (BID) | ORAL | 0 refills | Status: AC

## 2022-12-16 ENCOUNTER — Other Ambulatory Visit: Payer: Self-pay | Admitting: Family

## 2022-12-16 DIAGNOSIS — F909 Attention-deficit hyperactivity disorder, unspecified type: Secondary | ICD-10-CM

## 2022-12-16 NOTE — Telephone Encounter (Signed)
LM FOR PT TO CB TO SCHED APPT

## 2022-12-19 NOTE — Telephone Encounter (Signed)
LM for pt to cb

## 2022-12-28 ENCOUNTER — Encounter: Payer: Self-pay | Admitting: Family

## 2023-01-02 ENCOUNTER — Other Ambulatory Visit: Payer: Self-pay | Admitting: Family

## 2023-01-02 ENCOUNTER — Telehealth: Payer: Self-pay | Admitting: Family

## 2023-01-02 DIAGNOSIS — Z803 Family history of malignant neoplasm of breast: Secondary | ICD-10-CM

## 2023-01-02 NOTE — Telephone Encounter (Signed)
Lft pt vm to call ofc to sch MRI. thanks

## 2023-01-04 ENCOUNTER — Telehealth: Payer: Self-pay | Admitting: Family

## 2023-01-04 NOTE — Telephone Encounter (Signed)
Lft pt vm to call ofc to sch

## 2023-01-05 NOTE — Telephone Encounter (Signed)
Patient returned [employer].  I transferred call to [place].

## 2023-01-31 ENCOUNTER — Ambulatory Visit
Admission: RE | Admit: 2023-01-31 | Discharge: 2023-01-31 | Disposition: A | Discharge status: Home / Self Care | Payer: Federal, State, Local not specified - PPO | Source: Ambulatory Visit | Attending: Family | Admitting: Family

## 2023-01-31 DIAGNOSIS — Z803 Family history of malignant neoplasm of breast: Secondary | ICD-10-CM | POA: Diagnosis not present

## 2023-01-31 DIAGNOSIS — Z1239 Encounter for other screening for malignant neoplasm of breast: Secondary | ICD-10-CM | POA: Diagnosis not present

## 2023-01-31 MED ORDER — GADOBUTROL 1 MMOL/ML IV SOLN
5.0000 mL | Freq: Once | INTRAVENOUS | Status: AC | PRN
  Administered 2023-01-31: 5 mL via INTRAVENOUS

## 2023-05-23 ENCOUNTER — Other Ambulatory Visit (HOSPITAL_COMMUNITY)
Admission: RE | Admit: 2023-05-23 | Discharge: 2023-05-23 | Disposition: A | Discharge status: Home / Self Care | Payer: Federal, State, Local not specified - PPO | Source: Ambulatory Visit | Attending: Family | Admitting: Family

## 2023-05-23 ENCOUNTER — Ambulatory Visit (INDEPENDENT_AMBULATORY_CARE_PROVIDER_SITE_OTHER): Payer: Federal, State, Local not specified - PPO

## 2023-05-23 ENCOUNTER — Ambulatory Visit: Payer: Federal, State, Local not specified - PPO | Admitting: Family

## 2023-05-23 ENCOUNTER — Other Ambulatory Visit: Payer: Self-pay | Admitting: Family

## 2023-05-23 ENCOUNTER — Encounter: Payer: Self-pay | Admitting: Family

## 2023-05-23 VITALS — BP 110/82 | HR 87 | Temp 98.5°F | Ht 61.0 in | Wt 104.0 lb

## 2023-05-23 DIAGNOSIS — R5383 Other fatigue: Secondary | ICD-10-CM

## 2023-05-23 DIAGNOSIS — Z0000 Encounter for general adult medical examination without abnormal findings: Secondary | ICD-10-CM | POA: Diagnosis not present

## 2023-05-23 DIAGNOSIS — Z803 Family history of malignant neoplasm of breast: Secondary | ICD-10-CM | POA: Diagnosis not present

## 2023-05-23 DIAGNOSIS — Z136 Encounter for screening for cardiovascular disorders: Secondary | ICD-10-CM

## 2023-05-23 DIAGNOSIS — F909 Attention-deficit hyperactivity disorder, unspecified type: Secondary | ICD-10-CM

## 2023-05-23 DIAGNOSIS — Z13220 Encounter for screening for lipoid disorders: Secondary | ICD-10-CM | POA: Diagnosis not present

## 2023-05-23 LAB — COMPREHENSIVE METABOLIC PANEL
ALT: 13 U/L (ref 0–35)
AST: 19 U/L (ref 0–37)
Albumin: 4.5 g/dL (ref 3.5–5.2)
Alkaline Phosphatase: 73 U/L (ref 39–117)
BUN: 14 mg/dL (ref 6–23)
CO2: 29 mEq/L (ref 19–32)
Calcium: 9.7 mg/dL (ref 8.4–10.5)
Chloride: 104 mEq/L (ref 96–112)
Creatinine, Ser: 0.7 mg/dL (ref 0.40–1.20)
GFR: 113.31 mL/min (ref >60.00)
Glucose, Bld: 81 mg/dL (ref 70–99)
Potassium: 4.1 mEq/L (ref 3.5–5.1)
Sodium: 139 mEq/L (ref 135–145)
Total Bilirubin: 0.8 mg/dL (ref 0.2–1.2)
Total Protein: 7.4 g/dL (ref 6.0–8.3)

## 2023-05-23 LAB — CBC WITH DIFFERENTIAL/PLATELET
Basophils Absolute: 0.1 10*3/uL (ref 0.0–0.1)
Basophils Relative: 1 % (ref 0.0–3.0)
Eosinophils Absolute: 0.1 10*3/uL (ref 0.0–0.7)
Eosinophils Relative: 1.6 % (ref 0.0–5.0)
HCT: 40.7 % (ref 36.0–46.0)
Hemoglobin: 13.2 g/dL (ref 12.0–15.0)
Lymphocytes Relative: 33.4 % (ref 12.0–46.0)
Lymphs Abs: 1.9 10*3/uL (ref 0.7–4.0)
MCHC: 32.3 g/dL (ref 30.0–36.0)
MCV: 84.7 fl (ref 78.0–100.0)
Monocytes Absolute: 0.4 10*3/uL (ref 0.1–1.0)
Monocytes Relative: 7.7 % (ref 3.0–12.0)
Neutro Abs: 3.2 10*3/uL (ref 1.4–7.7)
Neutrophils Relative %: 56.3 % (ref 43.0–77.0)
Platelets: 235 10*3/uL (ref 150.0–400.0)
RBC: 4.81 Mil/uL (ref 3.87–5.11)
RDW: 16.9 % — ABNORMAL HIGH (ref 11.5–15.5)
WBC: 5.7 10*3/uL (ref 4.0–10.5)

## 2023-05-23 LAB — IBC + FERRITIN
Ferritin: 7.7 ng/mL — ABNORMAL LOW (ref 10.0–291.0)
Iron: 105 ug/dL (ref 42–145)
Saturation Ratios: 21.7 % (ref 20.0–50.0)
TIBC: 483 ug/dL — ABNORMAL HIGH (ref 250.0–450.0)
Transferrin: 345 mg/dL (ref 212.0–360.0)

## 2023-05-23 LAB — LIPID PANEL
Cholesterol: 174 mg/dL (ref 0–200)
HDL: 86.3 mg/dL (ref >39.00)
LDL Cholesterol: 73 mg/dL (ref 0–99)
NonHDL: 87.55
Total CHOL/HDL Ratio: 2
Triglycerides: 72 mg/dL (ref 0.0–149.0)
VLDL: 14.4 mg/dL (ref 0.0–40.0)

## 2023-05-23 LAB — B12 AND FOLATE PANEL
Folate: 6.8 ng/mL (ref >5.9)
Vitamin B-12: 932 pg/mL — ABNORMAL HIGH (ref 211–911)

## 2023-05-23 LAB — TSH: TSH: 1.46 u[IU]/mL (ref 0.35–5.50)

## 2023-05-23 LAB — POCT URINE PREGNANCY: Preg Test, Ur: NEGATIVE

## 2023-05-23 LAB — VITAMIN D 25 HYDROXY (VIT D DEFICIENCY, FRACTURES): VITD: 34.61 ng/mL (ref 30.00–100.00)

## 2023-05-23 NOTE — Assessment & Plan Note (Addendum)
Etiology unclear at this time.  Improves with exercise.    Lab Results  Component Value Date   FERRITIN 7.7 (L) 05/23/2023   Ferritin level is low;  in the setting of symptomatic fatigue advised over-the-counter multivitamin with iron.  Advised to make follow-up if fatigue doesn't resolves.

## 2023-05-23 NOTE — Patient Instructions (Addendum)
Schedule mammogram 08/09/23   Please start over-the-counter ferrous sulfate as I recommended in your result note.  If fatigue does not resolve completely, please let me know so we can begin further evaluation  Health Maintenance, Female Adopting a healthy lifestyle and getting preventive care are important in promoting health and wellness. Ask your health care provider about: The right schedule for you to have regular tests and exams. Things you can do on your own to prevent diseases and keep yourself healthy. What should I know about diet, weight, and exercise? Eat a healthy diet  Eat a diet that includes plenty of vegetables, fruits, low-fat dairy products, and lean protein. Do not eat a lot of foods that are high in solid fats, added sugars, or sodium. Maintain a healthy weight Body mass index (BMI) is used to identify weight problems. It estimates body fat based on height and weight. Your health care provider can help determine your BMI and help you achieve or maintain a healthy weight. Get regular exercise Get regular exercise. This is one of the most important things you can do for your health. Most adults should: Exercise for at least 150 minutes each week. The exercise should increase your heart rate and make you sweat (moderate-intensity exercise). Do strengthening exercises at least twice a week. This is in addition to the moderate-intensity exercise. Spend less time sitting. Even light physical activity can be beneficial. Watch cholesterol and blood lipids Have your blood tested for lipids and cholesterol at 34 years of age, then have this test every 5 years. Have your cholesterol levels checked more often if: Your lipid or cholesterol levels are high. You are older than 34 years of age. You are at high risk for heart disease. What should I know about cancer screening? Depending on your health history and family history, you may need to have cancer screening at various ages.  This may include screening for: Breast cancer. Cervical cancer. Colorectal cancer. Skin cancer. Lung cancer. What should I know about heart disease, diabetes, and high blood pressure? Blood pressure and heart disease High blood pressure causes heart disease and increases the risk of stroke. This is more likely to develop in people who have high blood pressure readings or are overweight. Have your blood pressure checked: Every 3-5 years if you are 53-13 years of age. Every year if you are 72 years old or older. Diabetes Have regular diabetes screenings. This checks your fasting blood sugar level. Have the screening done: Once every three years after age 75 if you are at a normal weight and have a low risk for diabetes. More often and at a younger age if you are overweight or have a high risk for diabetes. What should I know about preventing infection? Hepatitis B If you have a higher risk for hepatitis B, you should be screened for this virus. Talk with your health care provider to find out if you are at risk for hepatitis B infection. Hepatitis C Testing is recommended for: Everyone born from [birth year] through 1967. Anyone with known risk factors for hepatitis C. Sexually transmitted infections (STIs) Get screened for STIs, including gonorrhea and chlamydia, if: You are sexually active and are younger than 34 years of age. You are older than 34 years of age and your health care provider tells you that you are at risk for this type of infection. Your sexual activity has changed since you were last screened, and you are at increased risk for chlamydia or gonorrhea. Ask your  health care provider if you are at risk. Ask your health care provider about whether you are at high risk for HIV. Your health care provider may recommend a prescription medicine to help prevent HIV infection. If you choose to take medicine to prevent HIV, you should first get tested for HIV. You should then be tested every 3  months for as long as you are taking the medicine. Pregnancy If you are about to stop having your period (premenopausal) and you may become pregnant, seek counseling before you get pregnant. Take 400 to 800 micrograms (mcg) of folic acid every day if you become pregnant. Ask for birth control (contraception) if you want to prevent pregnancy. Osteoporosis and menopause Osteoporosis is a disease in which the bones lose minerals and strength with aging. This can result in bone fractures. If you are 23 years old or older, or if you are at risk for osteoporosis and fractures, ask your health care provider if you should: Be screened for bone loss. Take a calcium or vitamin D supplement to lower your risk of fractures. Be given hormone replacement therapy (HRT) to treat symptoms of menopause. Follow these instructions at home: Alcohol use Do not drink alcohol if: Your health care provider tells you not to drink. You are pregnant, may be pregnant, or are planning to become pregnant. If you drink alcohol: Limit how much you have to: 0-1 drink a day. Know how much alcohol is in your drink. In the [place] one drink equals one 12 oz bottle of beer (355 mL), one 5 oz glass of wine (148 mL), or one 1 oz glass of hard liquor (44 mL). Lifestyle Do not use any products that contain nicotine or tobacco. These products include cigarettes, chewing tobacco, and vaping devices, such as e-cigarettes. If you need help quitting, ask your health care provider. Do not use street drugs. Do not share needles. Ask your health care provider for help if you need support or information about quitting drugs. General instructions Schedule regular health, dental, and eye exams. Stay current with your vaccines. Tell your health care provider if: You often feel depressed. You have ever been abused or do not feel safe at home. Summary Adopting a healthy lifestyle and getting preventive care are important in promoting health  and wellness. Follow your health care provider's instructions about healthy diet, exercising, and getting tested or screened for diseases. Follow your health care provider's instructions on monitoring your cholesterol and blood pressure. This information is not intended to replace advice given to you by your health care provider. Make sure you discuss any questions you have with your health care provider. Document Revised: 04/05/2021 Document Reviewed: 04/05/2021 Elsevier Patient Education  2024 [employer].

## 2023-05-23 NOTE — Progress Notes (Addendum)
Assessment & Plan:  Routine physical examination Assessment & Plan: Clinical breast exam performed today.  Pap smear obtained.  Patient will schedule mammogram for this September.  MRI breasts is due 01/2024. Bilateral cerumen impaction.  Patient will start Debrox for 2 days ahead of ear irrigation  Orders: -     CBC with Differential/Platelet -     Comprehensive metabolic panel -     TSH -     Cytology - PAP -     VITAMIN D 25 Hydroxy (Vit-D Deficiency, Fractures) -     3D Screening Mammogram, Left and Right; Future -     B12 and Folate Panel  Other fatigue Assessment & Plan: Etiology unclear at this time.  Improves with exercise.    Lab Results  Component Value Date   FERRITIN 7.7 (L) 05/23/2023   Ferritin level is low;  in the setting of symptomatic fatigue advised over-the-counter multivitamin with iron.  Advised to make follow-up if fatigue doesn't resolves.   Orders: -     TSH -     Cytology - PAP -     VITAMIN D 25 Hydroxy (Vit-D Deficiency, Fractures) -     B12 and Folate Panel -     DG Chest 2 View; Future -     IBC + Ferritin -     POCT urine pregnancy  Encounter for lipid screening for cardiovascular disease -     Lipid panel  Family history of breast cancer -     3D Screening Mammogram, Left and Right; Future     Return precautions given.   Risks, benefits, and alternatives of the medications and treatment plan prescribed today were discussed, and patient expressed understanding.   Education regarding symptom management and diagnosis given to patient on AVS either electronically or printed.  No follow-ups on file.  [name], FNP  Subjective:    Patient ID: Danielle Casey, female    DOB: 1991/06/19, 32 y.o.   MRN: [id number]  CC: Danielle Casey is a 32 y.o. female who presents today for physical exam.    HPI:  Complains of episodic fatigue, noticed 6 months ago, unchanged.  More noticeable in the afternoon.  She sleeps 8 hours per  night.  She drink one cup coffee, half 5 hour energy shot.  She doesn't nap.  No associated HA, weight loss, cp, sob, palpitations, night sweats.  She doesn't exercise on the days. No fatigue after exercise.       Complains of ear pain.  Colorectal Cancer Screening: No FGR  history of colon cancer Breast Cancer Screening: MR Breasts due 01/2024; mother had breast cancer at age 75 years.  Cervical Cancer Screening: due        Tetanus - UTD      Exercise: Gets regular exercise.   Alcohol use: Occasional Smoking/tobacco use: e cig, Juul, smoker.    Health Maintenance  Topic Date Due   COVID-19 Vaccine (3 - 2023-24 season) 07/29/2022   Flu Shot  06/29/2023   Pap Smear  05/22/2026   DTaP/Tdap/Td vaccine (2 - Td or Tdap) 10/09/2026   Hepatitis C Screening  Completed   HIV Screening  Completed   HPV Vaccine  Aged Out    ALLERGIES: Patient has no known allergies.  Current Outpatient Medications on File Prior to Visit  Medication Sig Dispense Refill   amphetamine-dextroamphetamine (ADDERALL) 10 MG tablet Take 1 tablet (10 mg total) by mouth 2 (two) times daily. 60 tablet 0  amphetamine-dextroamphetamine (ADDERALL) 10 MG tablet Take 1 tablet (10 mg total) by mouth 2 (two) times daily with a meal. 60 tablet 0   amphetamine-dextroamphetamine (ADDERALL) 10 MG tablet Take 1 tablet (10 mg total) by mouth 2 (two) times daily. 60 tablet 0   No current facility-administered medications on file prior to visit.    Review of Systems  Constitutional:  Positive for fatigue. Negative for chills, fever and unexpected weight change.  HENT:  Negative for congestion.   Respiratory:  Negative for cough.   Cardiovascular:  Negative for chest pain, palpitations and leg swelling.  Gastrointestinal:  Negative for nausea and vomiting.  Musculoskeletal:  Negative for arthralgias and myalgias.  Skin:  Negative for rash.  Neurological:  Negative for headaches.  Hematological:  Negative for  adenopathy.  Psychiatric/Behavioral:  Negative for confusion.       Objective:    BP 110/82   Pulse 87   Temp 98.5 F (36.9 C) (Oral)   Ht 5\' 1"  (1.549 m)   Wt 104 lb (47.2 kg)   SpO2 98%   BMI 19.65 kg/m   BP Readings from Last 3 Encounters:  05/23/23 110/82  09/21/22 104/80  02/25/22 108/66   Wt Readings from Last 3 Encounters:  05/23/23 104 lb (47.2 kg)  09/21/22 98 lb 12.8 oz (44.8 kg)  02/25/22 103 lb 3.2 oz (46.8 kg)    Physical Exam Vitals reviewed.  Constitutional:      Appearance: Normal appearance. She is well-developed.  HENT:     Right Ear: There is impacted cerumen.     Left Ear: There is impacted cerumen.  Eyes:     Conjunctiva/sclera: Conjunctivae normal.  Neck:     Thyroid: No thyroid mass or thyromegaly.  Cardiovascular:     Rate and Rhythm: Normal rate and regular rhythm.     Pulses: Normal pulses.     Heart sounds: Normal heart sounds.  Pulmonary:     Effort: Pulmonary effort is normal.     Breath sounds: Normal breath sounds. No wheezing, rhonchi or rales.  Chest:  Breasts:    Breasts are symmetrical.     Right: No inverted nipple, mass, nipple discharge, skin change or tenderness.     Left: No inverted nipple, mass, nipple discharge, skin change or tenderness.  Abdominal:     General: Bowel sounds are normal. There is no distension.     Palpations: Abdomen is soft. Abdomen is not rigid. There is no fluid wave or mass.     Tenderness: There is no abdominal tenderness. There is no guarding or rebound.  Genitourinary:    Cervix: No cervical motion tenderness, discharge or friability.     Uterus: Not enlarged, not fixed and not tender.      Adnexa:        Right: No mass, tenderness or fullness.         Left: No mass, tenderness or fullness.       Comments: Pap performed. No CMT. Unable to appreciated ovaries. Lymphadenopathy:     Head:     Right side of head: No submental, submandibular, tonsillar, preauricular, posterior auricular or  occipital adenopathy.     Left side of head: No submental, submandibular, tonsillar, preauricular, posterior auricular or occipital adenopathy.     Cervical:     Right cervical: No superficial, deep or posterior cervical adenopathy.    Left cervical: No superficial, deep or posterior cervical adenopathy.     Upper Body:  Right upper body: No pectoral adenopathy.     Left upper body: No pectoral adenopathy.  Skin:    General: Skin is warm and dry.  Neurological:     Mental Status: She is alert.  Psychiatric:        Speech: Speech normal.        Behavior: Behavior normal.        Thought Content: Thought content normal.

## 2023-05-25 LAB — CYTOLOGY - PAP
Comment: NEGATIVE
Diagnosis: NEGATIVE
High risk HPV: NEGATIVE

## 2023-05-26 NOTE — Assessment & Plan Note (Addendum)
Clinical breast exam performed today.  Pap smear obtained.  Patient will schedule mammogram for this September.  MRI breasts is due 01/2024. Bilateral cerumen impaction.  Patient will start Debrox for 2 days ahead of ear irrigation

## 2023-05-29 ENCOUNTER — Ambulatory Visit (INDEPENDENT_AMBULATORY_CARE_PROVIDER_SITE_OTHER): Payer: Federal, State, Local not specified - PPO

## 2023-05-29 DIAGNOSIS — H6123 Impacted cerumen, bilateral: Secondary | ICD-10-CM | POA: Diagnosis not present

## 2023-05-29 NOTE — Progress Notes (Signed)
Pt presents to have an ear irrigation done. Pt was identified through two identifiers. The process was explained to the pt thoroughly. I provided pt with a gown to help protect her clothing.  I told pt to let me know if at any moment it starts to hurt, she feels dizzy, or needs a minute. Before I began I placed a few drops of peroxide in right ear and had her lie down on her left side. This step was done on the opposite ear as well.   Before I began flushing I checked in each ear so that I can track my progress. I had pt feel the water to see if it was too hot or too cold. Pt verbalized it was okay so I proceeded; periodically asking her if she was okay and how she was feeling. Once pt verbalized she was okay to continue, I did.   After checking the right ear and it still had wax in/ no change at all after using a whole bottle, I informed pt that I would switch ears. There was significant change with the left ear flushing and pt verbalized she was okay. I then moved pt to a room near PCP where she can evaluate pt. PCP's CMA, Loletta Specter was informed that the left ear was clear but there was trouble with the right ear. Loletta Specter, CMA informed me that she will let PCP Arnett know that she was ready for her to evaluate.

## 2023-06-07 ENCOUNTER — Telehealth: Payer: Self-pay

## 2023-06-07 NOTE — Telephone Encounter (Signed)
Sending phone note to provider due to pt having an ear irrigation on 05-29-23.   Coding query needs provider to addend the last office visit and state that pt has cerumen impact and that they want pt to have an ear lavage   Once this addendum is done please send note back to me so I can inform billing.

## 2023-06-15 ENCOUNTER — Ambulatory Visit (INDEPENDENT_AMBULATORY_CARE_PROVIDER_SITE_OTHER): Payer: Federal, State, Local not specified - PPO

## 2023-06-15 ENCOUNTER — Other Ambulatory Visit: Payer: Self-pay | Admitting: Podiatry

## 2023-06-15 ENCOUNTER — Ambulatory Visit: Payer: Federal, State, Local not specified - PPO | Admitting: Podiatry

## 2023-06-15 DIAGNOSIS — M21619 Bunion of unspecified foot: Secondary | ICD-10-CM

## 2023-06-15 DIAGNOSIS — M21612 Bunion of left foot: Secondary | ICD-10-CM

## 2023-06-15 DIAGNOSIS — Q666 Other congenital valgus deformities of feet: Secondary | ICD-10-CM | POA: Diagnosis not present

## 2023-06-15 DIAGNOSIS — Z01818 Encounter for other preprocedural examination: Secondary | ICD-10-CM

## 2023-06-15 NOTE — Progress Notes (Signed)
Subjective:  Patient ID: Danielle Casey, female    DOB: 05-24-1991,  MRN: [id number]  Chief Complaint  Patient presents with   Bunions    Pt stated that she has a bunion on her left foot that is causing some pain and discomfort     32 y.o. female presents with the above complaint.  Patient presents with left severe bunion deformity.  Patient states is painful to touch is progressive gotten worse.  She is try some shoe gear modification and padding protecting offloading the bunion deformity but nothing has helped.  She wanted to discuss surgical options that has grown in frequency is causing her a lot of pain and discomfort.  Pain scale 7 out of 10 dull achy in nature.  Review of Systems: Negative except as noted in the HPI. Denies N/V/F/Ch.  Past Medical History:  Diagnosis Date   Migraines     Current Outpatient Medications:    ciprofloxacin (CIPRO) 500 MG tablet, SMARTSIG:1 Tablet(s) By Mouth Every 12 Hours, Disp: , Rfl:    ciprofloxacin (CIPRO) 500 MG tablet, Take 500 mg by mouth 2 (two) times daily., Disp: , Rfl:    HYDROcodone-acetaminophen (NORCO/VICODIN) 5-325 MG tablet, Take 1 tablet by mouth every 4 (four) hours as needed., Disp: , Rfl:    ondansetron (ZOFRAN) 4 MG tablet, Take 4 mg by mouth 4 (four) times daily as needed., Disp: , Rfl:    traMADol (ULTRAM) 50 MG tablet, Take 50 mg by mouth every 6 (six) hours as needed., Disp: , Rfl:    amphetamine-dextroamphetamine (ADDERALL) 10 MG tablet, Take 1 tablet (10 mg total) by mouth 2 (two) times daily., Disp: 60 tablet, Rfl: 0   amphetamine-dextroamphetamine (ADDERALL) 10 MG tablet, Take 1 tablet (10 mg total) by mouth 2 (two) times daily with a meal., Disp: 60 tablet, Rfl: 0   amphetamine-dextroamphetamine (ADDERALL) 10 MG tablet, Take 1 tablet (10 mg total) by mouth 2 (two) times daily., Disp: 60 tablet, Rfl: 0  Social History   Tobacco Use  Smoking Status Former   Types: E-cigarettes  Smokeless Tobacco Current  Tobacco  Comments   Juul regularly    No Known Allergies Objective:  There were no vitals filed for this visit. There is no height or weight on file to calculate BMI. Constitutional Well developed. Well nourished.  Vascular Dorsalis pedis pulses palpable bilaterally. Posterior tibial pulses palpable bilaterally. Capillary refill normal to all digits.  No cyanosis or clubbing noted. Pedal hair growth normal.  Neurologic Normal speech. Oriented to person, place, and time. Epicritic sensation to light touch grossly present bilaterally.  Dermatologic Nails well groomed and normal in appearance. No open wounds. No skin lesions.  Orthopedic: Normal joint ROM without pain or crepitus bilaterally. Hallux abductovalgus deformity present Left 1st MPJ diminished range of motion. Left 1st TMT with gross hypermobility. Right 1st MPJ diminished range of motion  Right 1st TMT without gross hypermobility. Lesser digital contractures absent bilaterally.  Pes planovalgus deformity noted with calcaneovalgus to many toe signs partially recruit the arch with dorsiflexion of the hallux unable to perform single and double heel raise   Radiographs: Taken and reviewed. Hallux abductovalgus deformity present. Metatarsal parabola normal. 1st/2nd IMA: 16 degrees; TSP: 5 out of 7  Assessment:   1. Bunion   2. Pes planovalgus   3. Encounter for preoperative examination for general surgical procedure    Plan:  Patient was evaluated and treated and all questions answered.  Hallux abductovalgus deformity, left -XR as above. -Patient  has failed all conservative therapy and wishes to proceed with surgical intervention. All risks, benefits, and alternatives discussed with patient. No guarantees given. Consent reviewed and signed by patient. Post-op course explained at length. -Planned procedures: Left Lapidus bunionectomy with possible phalangeal osteotomy -Risk factors: None -I discussed my preoperative intra  postoperative plan with the patient in extensive detail she states understanding like to proceed with listed above surgery. -Informed surgical risk consent was reviewed and read aloud to the patient.  I reviewed the films.  I have discussed my findings with the patient in great detail.  I have discussed all risks including but not limited to infection, stiffness, scarring, limp, disability, deformity, damage to blood vessels and nerves, numbness, poor healing, need for braces, arthritis, chronic pain, amputation, death.  All benefits and realistic expectations discussed in great detail.  I have made no promises as to the outcome.  I have provided realistic expectations.  I have offered the patient a 2nd opinion, which they have declined and assured me they preferred to proceed despite the risks  Pes planovalgus -I explained to patient the etiology of pes planovalgus and relationship with bunion and various treatment options were discussed.  Given patient foot structure in the setting of 1 and only I believe patient will benefit from custom-made orthotics to help control the hindfoot motion support the arch of the foot and take the stress away from plantar fascial.  Patient agrees with the plan like to proceed with orthotics -Patient was casted for orthotics   No follow-ups on file.

## 2023-06-20 ENCOUNTER — Telehealth: Payer: Self-pay | Admitting: Podiatry

## 2023-06-20 NOTE — Telephone Encounter (Signed)
DOS 07/10/2023  AIKEN OSTEOTOMY LT - 60454 LAPIDUS PROCEDURE INCLUDING LAPIPLASTY LT - 09811  [employer] EFFECTIVE DATE - 06/12/2022  DED N/A OOP $6500 W/ $9147.82 REMAINING COINS  PER [place] B AT [employer], CPT CODES 95621 AND 28297 DO NOT REQUIRE PRIOR AUTHORIZATION.  REFERENCE NUMBER - INQ [id number]

## 2023-07-10 ENCOUNTER — Other Ambulatory Visit: Payer: Self-pay | Admitting: Podiatry

## 2023-07-10 DIAGNOSIS — G8918 Other acute postprocedural pain: Secondary | ICD-10-CM | POA: Diagnosis not present

## 2023-07-10 DIAGNOSIS — M21612 Bunion of left foot: Secondary | ICD-10-CM | POA: Diagnosis not present

## 2023-07-10 DIAGNOSIS — M2012 Hallux valgus (acquired), left foot: Secondary | ICD-10-CM | POA: Diagnosis not present

## 2023-07-10 MED ORDER — OXYCODONE-ACETAMINOPHEN 5-325 MG PO TABS: 1.0000 | ORAL_TABLET | ORAL | 0 refills | Status: AC | PRN

## 2023-07-10 MED ORDER — IBUPROFEN 800 MG PO TABS: 800.0000 mg | ORAL_TABLET | Freq: Four times a day (QID) | ORAL | 1 refills | Status: AC | PRN

## 2023-07-12 ENCOUNTER — Telehealth: Payer: Self-pay

## 2023-07-12 NOTE — Telephone Encounter (Signed)
Patient called and left a message 8/13, stating she was in a lot of pain - I called her  and spoke with her today - she is doing much better. She wants to return to work on Monday 07/17/23 and asks for the note to be sent to her through MyChart. Thanks

## 2023-07-18 ENCOUNTER — Other Ambulatory Visit: Payer: Self-pay | Admitting: Podiatry

## 2023-07-18 ENCOUNTER — Ambulatory Visit (INDEPENDENT_AMBULATORY_CARE_PROVIDER_SITE_OTHER): Payer: Federal, State, Local not specified - PPO | Admitting: Podiatry

## 2023-07-18 ENCOUNTER — Ambulatory Visit (INDEPENDENT_AMBULATORY_CARE_PROVIDER_SITE_OTHER): Payer: Federal, State, Local not specified - PPO

## 2023-07-18 ENCOUNTER — Encounter: Payer: Self-pay | Admitting: Podiatry

## 2023-07-18 DIAGNOSIS — M21619 Bunion of unspecified foot: Secondary | ICD-10-CM

## 2023-07-18 DIAGNOSIS — Z9889 Other specified postprocedural states: Secondary | ICD-10-CM

## 2023-07-18 DIAGNOSIS — M21612 Bunion of left foot: Secondary | ICD-10-CM

## 2023-07-18 NOTE — Progress Notes (Signed)
  Subjective:  Patient ID: Danielle Casey, female    DOB: 1991-02-09,  MRN: [id number]  Chief Complaint  Patient presents with   Routine Post Op    POV #1 DOS 07/10/2023 --- LEFT LAPIDUS BUNIONECTOMY WITH PHALANGEAL OSTEOTOMY WITH IMPLANT /pick up orthotics    DOS: 07/10/2023 Procedure: Left Lapidus bunionectomy  32 y.o. female returns for post-op check.  Patient states she is doing well.  Denies any other acute complaints.  No nausea fever chills vomiting.  Pain controlled  Review of Systems: Negative except as noted in the HPI. Denies N/V/F/Ch.  Past Medical History:  Diagnosis Date   Migraines     Current Outpatient Medications:    amphetamine-dextroamphetamine (ADDERALL) 10 MG tablet, Take 1 tablet (10 mg total) by mouth 2 (two) times daily., Disp: 60 tablet, Rfl: 0   amphetamine-dextroamphetamine (ADDERALL) 10 MG tablet, Take 1 tablet (10 mg total) by mouth 2 (two) times daily with a meal., Disp: 60 tablet, Rfl: 0   amphetamine-dextroamphetamine (ADDERALL) 10 MG tablet, Take 1 tablet (10 mg total) by mouth 2 (two) times daily., Disp: 60 tablet, Rfl: 0   ciprofloxacin (CIPRO) 500 MG tablet, SMARTSIG:1 Tablet(s) By Mouth Every 12 Hours, Disp: , Rfl:    ciprofloxacin (CIPRO) 500 MG tablet, Take 500 mg by mouth 2 (two) times daily., Disp: , Rfl:    HYDROcodone-acetaminophen (NORCO/VICODIN) 5-325 MG tablet, Take 1 tablet by mouth every 4 (four) hours as needed., Disp: , Rfl:    ibuprofen (ADVIL) 800 MG tablet, Take 1 tablet (800 mg total) by mouth every 6 (six) hours as needed., Disp: 60 tablet, Rfl: 1   ondansetron (ZOFRAN) 4 MG tablet, Take 4 mg by mouth 4 (four) times daily as needed., Disp: , Rfl:    oxyCODONE-acetaminophen (PERCOCET) 5-325 MG tablet, Take 1 tablet by mouth every 4 (four) hours as needed for severe pain., Disp: 30 tablet, Rfl: 0   traMADol (ULTRAM) 50 MG tablet, Take 50 mg by mouth every 6 (six) hours as needed., Disp: , Rfl:   Social History   Tobacco Use   Smoking Status Former   Types: E-cigarettes  Smokeless Tobacco Current  Tobacco Comments   Juul regularly    No Known Allergies Objective:  There were no vitals filed for this visit. There is no height or weight on file to calculate BMI. Constitutional Well developed. Well nourished.  Vascular Foot warm and well perfused. Capillary refill normal to all digits.   Neurologic Normal speech. Oriented to person, place, and time. Epicritic sensation to light touch grossly present bilaterally.  Dermatologic Skin healing well without signs of infection. Skin edges well coapted without signs of infection.  Orthopedic: Tenderness to palpation noted about the surgical site.   Radiographs: 3 views of skeletally mature adult left foot: Good reduction of bunion deformity noted.  Hardware is intact. Assessment:   1. Bunion   2. Status post foot surgery    Plan:  Patient was evaluated and treated and all questions answered.  S/p foot surgery left -Progressing as expected post-operatively. -XR: See above -WB Status: Nonweightbearing in left lower extremity -Sutures: Intact.  No clinical signs of dehiscence noted no complication noted -Medications: None -Foot redressed.  No follow-ups on file.

## 2023-07-20 ENCOUNTER — Telehealth: Payer: Self-pay | Admitting: Podiatry

## 2023-07-20 ENCOUNTER — Other Ambulatory Visit: Payer: Self-pay | Admitting: Podiatry

## 2023-07-20 NOTE — Telephone Encounter (Signed)
Patient called twice.  She is 10 days out from [place] and is stating she is having severe nerve pain that's unbearable.  She wants a call back when you get a chance.   [phone]

## 2023-07-21 ENCOUNTER — Other Ambulatory Visit: Payer: Self-pay | Admitting: Podiatry

## 2023-07-21 MED ORDER — GABAPENTIN 100 MG PO CAPS: 100.0000 mg | ORAL_CAPSULE | Freq: Three times a day (TID) | ORAL | 3 refills | Status: AC

## 2023-08-01 ENCOUNTER — Encounter: Payer: Self-pay | Admitting: Podiatry

## 2023-08-01 ENCOUNTER — Ambulatory Visit (INDEPENDENT_AMBULATORY_CARE_PROVIDER_SITE_OTHER): Payer: Federal, State, Local not specified - PPO | Admitting: Podiatry

## 2023-08-01 DIAGNOSIS — Z9889 Other specified postprocedural states: Secondary | ICD-10-CM

## 2023-08-01 DIAGNOSIS — M21619 Bunion of unspecified foot: Secondary | ICD-10-CM

## 2023-08-01 NOTE — Progress Notes (Signed)
Patient states that  Subjective:  Patient ID: Danielle Casey, female    DOB: 17-May-1991,  MRN: [id number]  Chief Complaint  Patient presents with   Routine Post Op    POV #2 DOS 07/10/2023 --- LEFT LAPIDUS BUNIONECTOMY WITH PHALANGEAL OSTEOTOMY WITH IMPLANT    DOS: 07/10/2023 Procedure: Left Lapidus bunionectomy  32 y.o. female returns for post-op check.  Patient states she is doing well.  Denies any other acute complaints.  No nausea fever chills vomiting.  Pain controlled  Review of Systems: Negative except as noted in the HPI. Denies N/V/F/Ch.  Past Medical History:  Diagnosis Date   Migraines     Current Outpatient Medications:    amphetamine-dextroamphetamine (ADDERALL) 10 MG tablet, Take 1 tablet (10 mg total) by mouth 2 (two) times daily., Disp: 60 tablet, Rfl: 0   amphetamine-dextroamphetamine (ADDERALL) 10 MG tablet, Take 1 tablet (10 mg total) by mouth 2 (two) times daily with a meal., Disp: 60 tablet, Rfl: 0   amphetamine-dextroamphetamine (ADDERALL) 10 MG tablet, Take 1 tablet (10 mg total) by mouth 2 (two) times daily., Disp: 60 tablet, Rfl: 0   ciprofloxacin (CIPRO) 500 MG tablet, SMARTSIG:1 Tablet(s) By Mouth Every 12 Hours, Disp: , Rfl:    ciprofloxacin (CIPRO) 500 MG tablet, Take 500 mg by mouth 2 (two) times daily., Disp: , Rfl:    gabapentin (NEURONTIN) 100 MG capsule, Take 1 capsule (100 mg total) by mouth 3 (three) times daily., Disp: 90 capsule, Rfl: 3   HYDROcodone-acetaminophen (NORCO/VICODIN) 5-325 MG tablet, Take 1 tablet by mouth every 4 (four) hours as needed., Disp: , Rfl:    ibuprofen (ADVIL) 800 MG tablet, Take 1 tablet (800 mg total) by mouth every 6 (six) hours as needed., Disp: 60 tablet, Rfl: 1   ondansetron (ZOFRAN) 4 MG tablet, Take 4 mg by mouth 4 (four) times daily as needed., Disp: , Rfl:    oxyCODONE-acetaminophen (PERCOCET) 5-325 MG tablet, Take 1 tablet by mouth every 4 (four) hours as needed for severe pain., Disp: 30 tablet, Rfl: 0    traMADol (ULTRAM) 50 MG tablet, Take 50 mg by mouth every 6 (six) hours as needed., Disp: , Rfl:   Social History   Tobacco Use  Smoking Status Former   Types: E-cigarettes  Smokeless Tobacco Current  Tobacco Comments   Juul regularly    No Known Allergies Objective:  There were no vitals filed for this visit. There is no height or weight on file to calculate BMI. Constitutional Well developed. Well nourished.  Vascular Foot warm and well perfused. Capillary refill normal to all digits.   Neurologic Normal speech. Oriented to person, place, and time. Epicritic sensation to light touch grossly present bilaterally.  Dermatologic Skin completely epithelialized.  Some superficial dehiscence noted.  No infection noted good correction alignment noted.  Orthopedic: Tenderness to palpation noted about the surgical site.   Radiographs: 3 views of skeletally mature adult left foot: Good reduction of bunion deformity noted.  Hardware is intact. Assessment:   1. Bunion   2. Status post foot surgery     Plan:  Patient was evaluated and treated and all questions answered.  S/p foot surgery left -Progressing as expected post-operatively. -XR: See above -WB Status: Nonweightbearing in left lower extremity -Sutures: Intact.  Superficial dehiscence noted.  Patient states that she fell multiple times take her that due to superficial dehiscence.  Reinforced with Steri-Strips we will plan on clipping the stitches during next visit -Medications: None -Foot redressed.  No  follow-ups on file.

## 2023-08-10 ENCOUNTER — Ambulatory Visit
Admission: RE | Admit: 2023-08-10 | Discharge: 2023-08-10 | Disposition: A | Discharge status: Home / Self Care | Payer: Federal, State, Local not specified - PPO | Source: Ambulatory Visit | Attending: Family | Admitting: Family

## 2023-08-10 DIAGNOSIS — Z0000 Encounter for general adult medical examination without abnormal findings: Secondary | ICD-10-CM | POA: Insufficient documentation

## 2023-08-10 DIAGNOSIS — Z1231 Encounter for screening mammogram for malignant neoplasm of breast: Secondary | ICD-10-CM | POA: Diagnosis not present

## 2023-08-10 DIAGNOSIS — Z803 Family history of malignant neoplasm of breast: Secondary | ICD-10-CM | POA: Insufficient documentation

## 2023-08-11 ENCOUNTER — Ambulatory Visit (INDEPENDENT_AMBULATORY_CARE_PROVIDER_SITE_OTHER): Payer: Federal, State, Local not specified - PPO | Admitting: Podiatry

## 2023-08-11 ENCOUNTER — Ambulatory Visit (INDEPENDENT_AMBULATORY_CARE_PROVIDER_SITE_OTHER): Payer: Federal, State, Local not specified - PPO

## 2023-08-11 DIAGNOSIS — Z9889 Other specified postprocedural states: Secondary | ICD-10-CM | POA: Diagnosis not present

## 2023-08-11 DIAGNOSIS — M21619 Bunion of unspecified foot: Secondary | ICD-10-CM

## 2023-08-11 NOTE — Progress Notes (Signed)
Patient states that  Subjective:  Patient ID: Danielle Casey, female    DOB: 10-02-1991,  MRN: [id number]  No chief complaint on file.   DOS: 07/10/2023 Procedure: Left Lapidus bunionectomy  32 y.o. female returns for post-op check.  Patient states she is doing well.  Denies any other acute complaints.  No nausea fever chills vomiting.  Pain controlled  Review of Systems: Negative except as noted in the HPI. Denies N/V/F/Ch.  Past Medical History:  Diagnosis Date   Migraines     Current Outpatient Medications:    amphetamine-dextroamphetamine (ADDERALL) 10 MG tablet, Take 1 tablet (10 mg total) by mouth 2 (two) times daily., Disp: 60 tablet, Rfl: 0   amphetamine-dextroamphetamine (ADDERALL) 10 MG tablet, Take 1 tablet (10 mg total) by mouth 2 (two) times daily with a meal., Disp: 60 tablet, Rfl: 0   amphetamine-dextroamphetamine (ADDERALL) 10 MG tablet, Take 1 tablet (10 mg total) by mouth 2 (two) times daily., Disp: 60 tablet, Rfl: 0   ciprofloxacin (CIPRO) 500 MG tablet, SMARTSIG:1 Tablet(s) By Mouth Every 12 Hours, Disp: , Rfl:    ciprofloxacin (CIPRO) 500 MG tablet, Take 500 mg by mouth 2 (two) times daily., Disp: , Rfl:    gabapentin (NEURONTIN) 100 MG capsule, Take 1 capsule (100 mg total) by mouth 3 (three) times daily., Disp: 90 capsule, Rfl: 3   HYDROcodone-acetaminophen (NORCO/VICODIN) 5-325 MG tablet, Take 1 tablet by mouth every 4 (four) hours as needed., Disp: , Rfl:    ibuprofen (ADVIL) 800 MG tablet, Take 1 tablet (800 mg total) by mouth every 6 (six) hours as needed., Disp: 60 tablet, Rfl: 1   ondansetron (ZOFRAN) 4 MG tablet, Take 4 mg by mouth 4 (four) times daily as needed., Disp: , Rfl:    oxyCODONE-acetaminophen (PERCOCET) 5-325 MG tablet, Take 1 tablet by mouth every 4 (four) hours as needed for severe pain., Disp: 30 tablet, Rfl: 0   traMADol (ULTRAM) 50 MG tablet, Take 50 mg by mouth every 6 (six) hours as needed., Disp: , Rfl:   Social History   Tobacco Use   Smoking Status Former   Types: E-cigarettes  Smokeless Tobacco Current  Tobacco Comments   Juul regularly    No Known Allergies Objective:  There were no vitals filed for this visit. There is no height or weight on file to calculate BMI. Constitutional Well developed. Well nourished.  Vascular Foot warm and well perfused. Capillary refill normal to all digits.   Neurologic Normal speech. Oriented to person, place, and time. Epicritic sensation to light touch grossly present bilaterally.  Dermatologic Skin completely epithelialized.  No further superficial dehiscence noted.  No infection noted good correction alignment noted.  Orthopedic: Tenderness to palpation noted about the surgical site.   Radiographs: 3 views of skeletally mature adult left foot: Good reduction of bunion deformity noted.  Hardware is intact. Assessment:   1. Status post foot surgery     Plan:  Patient was evaluated and treated and all questions answered.  S/p foot surgery left -Progressing as expected post-operatively. -XR: See above -WB Status: Weightbearing as tolerated in cam boot -Sutures: Removed no signs of dehiscence noted. -Medications: None -Foot redressed.  No follow-ups on file.

## 2023-09-07 ENCOUNTER — Ambulatory Visit: Payer: Federal, State, Local not specified - PPO | Admitting: Podiatry

## 2023-09-07 ENCOUNTER — Ambulatory Visit: Payer: Federal, State, Local not specified - PPO

## 2023-09-07 ENCOUNTER — Encounter: Payer: Self-pay | Admitting: Podiatry

## 2023-09-07 VITALS — BP 101/61 | HR 70

## 2023-09-07 DIAGNOSIS — Z9889 Other specified postprocedural states: Secondary | ICD-10-CM | POA: Diagnosis not present

## 2023-09-07 DIAGNOSIS — M21619 Bunion of unspecified foot: Secondary | ICD-10-CM

## 2023-09-07 NOTE — Progress Notes (Signed)
Patient states that  Subjective:  Patient ID: Danielle Casey, female    DOB: 1991-06-01,  MRN: [id number]  Chief Complaint  Patient presents with   Routine Post Op    DOS: 07/10/2023  Left Lapidus bunionectomy "It's actually doing well."    DOS: 07/10/2023 Procedure: Left Lapidus bunionectomy  32 y.o. female returns for post-op check.  Patient states she is doing well.  Denies any other acute complaints.  No nausea fever chills vomiting.  Pain controlled  Review of Systems: Negative except as noted in the HPI. Denies N/V/F/Ch.  Past Medical History:  Diagnosis Date   Migraines     Current Outpatient Medications:    amphetamine-dextroamphetamine (ADDERALL) 10 MG tablet, Take 1 tablet (10 mg total) by mouth 2 (two) times daily., Disp: 60 tablet, Rfl: 0   amphetamine-dextroamphetamine (ADDERALL) 10 MG tablet, Take 1 tablet (10 mg total) by mouth 2 (two) times daily with a meal., Disp: 60 tablet, Rfl: 0   amphetamine-dextroamphetamine (ADDERALL) 10 MG tablet, Take 1 tablet (10 mg total) by mouth 2 (two) times daily. (Patient not taking: Reported on 09/07/2023), Disp: 60 tablet, Rfl: 0   ciprofloxacin (CIPRO) 500 MG tablet, SMARTSIG:1 Tablet(s) By Mouth Every 12 Hours (Patient not taking: Reported on 09/07/2023), Disp: , Rfl:    ciprofloxacin (CIPRO) 500 MG tablet, Take 500 mg by mouth 2 (two) times daily. (Patient not taking: Reported on 09/07/2023), Disp: , Rfl:    gabapentin (NEURONTIN) 100 MG capsule, Take 1 capsule (100 mg total) by mouth 3 (three) times daily. (Patient not taking: Reported on 09/07/2023), Disp: 90 capsule, Rfl: 3   HYDROcodone-acetaminophen (NORCO/VICODIN) 5-325 MG tablet, Take 1 tablet by mouth every 4 (four) hours as needed. (Patient not taking: Reported on 09/07/2023), Disp: , Rfl:    ibuprofen (ADVIL) 800 MG tablet, Take 1 tablet (800 mg total) by mouth every 6 (six) hours as needed. (Patient not taking: Reported on 09/07/2023), Disp: 60 tablet, Rfl: 1    ondansetron (ZOFRAN) 4 MG tablet, Take 4 mg by mouth 4 (four) times daily as needed. (Patient not taking: Reported on 09/07/2023), Disp: , Rfl:    oxyCODONE-acetaminophen (PERCOCET) 5-325 MG tablet, Take 1 tablet by mouth every 4 (four) hours as needed for severe pain. (Patient not taking: Reported on 09/07/2023), Disp: 30 tablet, Rfl: 0   traMADol (ULTRAM) 50 MG tablet, Take 50 mg by mouth every 6 (six) hours as needed. (Patient not taking: Reported on 09/07/2023), Disp: , Rfl:   Social History   Tobacco Use  Smoking Status Former   Types: E-cigarettes  Smokeless Tobacco Never  Tobacco Comments   Juul regularly    No Known Allergies Objective:   Vitals:   09/07/23 1608  BP: 101/61  Pulse: 70   There is no height or weight on file to calculate BMI. Constitutional Well developed. Well nourished.  Vascular Foot warm and well perfused. Capillary refill normal to all digits.   Neurologic Normal speech. Oriented to person, place, and time. Epicritic sensation to light touch grossly present bilaterally.  Dermatologic Skin completely epithelialized.  No further superficial dehiscence noted.  No infection noted good correction alignment noted.  Orthopedic: No further tenderness to palpation noted about the surgical site.   Radiographs: 3 views of skeletally mature adult left foot: Good reduction of bunion deformity noted.  Hardware is intact. Assessment:   1. Status post foot surgery     Plan:  Patient was evaluated and treated and all questions answered.  S/p foot surgery  left -Clinically healed and officially discharged from my care if any foot and ankle issues on future she will come back and see me.  This time I discussed shoe gear modification prevention technique if any foot and ankle issues on future come see me  No follow-ups on file.

## 2023-11-01 ENCOUNTER — Encounter: Payer: Self-pay | Admitting: Podiatry

## 2023-11-01 ENCOUNTER — Telehealth: Payer: Self-pay | Admitting: Podiatry

## 2023-11-01 NOTE — Telephone Encounter (Signed)
Pt called and has started a new job and they are requiring pt to get a not from [place] stating she has been released to go back to work with no restriction. If we could email it to her at [email or web address]. She has orientation on Monday so was hoping it would be done before then.

## 2023-11-01 NOTE — Telephone Encounter (Signed)
Emailed letter to pt and notified pt letter was emailed.
